# Patient Record
Sex: Male | Born: 1935
Health system: Southern US, Community
[De-identification: ages and names within clinical notes are randomized; demographics above are authoritative.]

## PROBLEM LIST (undated history)

## (undated) DIAGNOSIS — K602 Anal fissure, unspecified: Secondary | ICD-10-CM

## (undated) DIAGNOSIS — J189 Pneumonia, unspecified organism: Secondary | ICD-10-CM

## (undated) DIAGNOSIS — N400 Enlarged prostate without lower urinary tract symptoms: Secondary | ICD-10-CM

## (undated) DIAGNOSIS — Z9889 Other specified postprocedural states: Secondary | ICD-10-CM

## (undated) DIAGNOSIS — E039 Hypothyroidism, unspecified: Secondary | ICD-10-CM

## (undated) DIAGNOSIS — M1990 Unspecified osteoarthritis, unspecified site: Secondary | ICD-10-CM

## (undated) DIAGNOSIS — I2510 Atherosclerotic heart disease of native coronary artery without angina pectoris: Secondary | ICD-10-CM

## (undated) DIAGNOSIS — I350 Nonrheumatic aortic (valve) stenosis: Secondary | ICD-10-CM

## (undated) DIAGNOSIS — I359 Nonrheumatic aortic valve disorder, unspecified: Secondary | ICD-10-CM

## (undated) DIAGNOSIS — K219 Gastro-esophageal reflux disease without esophagitis: Secondary | ICD-10-CM

## (undated) DIAGNOSIS — I6529 Occlusion and stenosis of unspecified carotid artery: Secondary | ICD-10-CM

## (undated) DIAGNOSIS — I447 Left bundle-branch block, unspecified: Secondary | ICD-10-CM

## (undated) HISTORY — DX: Other specified postprocedural states: Z98.890

## (undated) HISTORY — PX: COLONOSCOPY: SHX174

## (undated) HISTORY — DX: Unspecified osteoarthritis, unspecified site: M19.90

## (undated) HISTORY — DX: Occlusion and stenosis of unspecified carotid artery: I65.29

## (undated) HISTORY — DX: Nonrheumatic aortic (valve) stenosis: I35.0

## (undated) HISTORY — DX: Pneumonia, unspecified organism: J18.9

## (undated) HISTORY — DX: Anal fissure, unspecified: K60.2

## (undated) HISTORY — DX: Benign prostatic hyperplasia without lower urinary tract symptoms: N40.0

## (undated) HISTORY — PX: KNEE SURGERY: SHX244

## (undated) HISTORY — DX: Other specified postprocedural states: Z98.89

## (undated) HISTORY — DX: Gastro-esophageal reflux disease without esophagitis: K21.9

## (undated) HISTORY — DX: Left bundle-branch block, unspecified: I44.7

## (undated) HISTORY — DX: Nonrheumatic aortic valve disorder, unspecified: I35.9

## (undated) HISTORY — DX: Atherosclerotic heart disease of native coronary artery without angina pectoris: I25.10

---

## 1973-08-25 HISTORY — PX: SHOULDER SURGERY: SHX246

## 1998-07-04 ENCOUNTER — Ambulatory Visit (HOSPITAL_COMMUNITY): Admission: RE | Admit: 1998-07-04 | Discharge: 1998-07-04 | Payer: Self-pay | Admitting: Specialist

## 1998-07-04 ENCOUNTER — Encounter: Payer: Self-pay | Admitting: Specialist

## 2001-08-25 HISTORY — PX: CORONARY ARTERY BYPASS GRAFT: SHX141

## 2002-03-22 ENCOUNTER — Inpatient Hospital Stay (HOSPITAL_COMMUNITY): Admission: AD | Admit: 2002-03-22 | Discharge: 2002-03-29 | Payer: Self-pay | Admitting: Cardiovascular Disease

## 2002-03-24 ENCOUNTER — Encounter: Payer: Self-pay | Admitting: Thoracic Surgery (Cardiothoracic Vascular Surgery)

## 2002-03-25 ENCOUNTER — Encounter: Payer: Self-pay | Admitting: Thoracic Surgery (Cardiothoracic Vascular Surgery)

## 2002-03-26 ENCOUNTER — Encounter: Payer: Self-pay | Admitting: Thoracic Surgery (Cardiothoracic Vascular Surgery)

## 2002-04-19 ENCOUNTER — Encounter (HOSPITAL_COMMUNITY): Admission: RE | Admit: 2002-04-19 | Discharge: 2002-07-18 | Payer: Self-pay | Admitting: Cardiovascular Disease

## 2002-05-11 ENCOUNTER — Encounter: Payer: Self-pay | Admitting: Thoracic Surgery (Cardiothoracic Vascular Surgery)

## 2002-05-11 ENCOUNTER — Encounter
Admission: RE | Admit: 2002-05-11 | Discharge: 2002-05-11 | Payer: Self-pay | Admitting: Thoracic Surgery (Cardiothoracic Vascular Surgery)

## 2003-06-05 ENCOUNTER — Ambulatory Visit (HOSPITAL_COMMUNITY): Admission: RE | Admit: 2003-06-05 | Discharge: 2003-06-05 | Payer: Self-pay | Admitting: Ophthalmology

## 2003-06-05 ENCOUNTER — Encounter: Payer: Self-pay | Admitting: Ophthalmology

## 2003-06-12 ENCOUNTER — Encounter: Payer: Self-pay | Admitting: Internal Medicine

## 2003-06-12 ENCOUNTER — Encounter: Admission: RE | Admit: 2003-06-12 | Discharge: 2003-06-12 | Payer: Self-pay | Admitting: Internal Medicine

## 2003-07-26 ENCOUNTER — Encounter: Admission: RE | Admit: 2003-07-26 | Discharge: 2003-07-26 | Payer: Self-pay | Admitting: Internal Medicine

## 2004-01-09 ENCOUNTER — Encounter: Admission: RE | Admit: 2004-01-09 | Discharge: 2004-01-09 | Payer: Self-pay | Admitting: Orthopedic Surgery

## 2004-01-11 ENCOUNTER — Ambulatory Visit (HOSPITAL_COMMUNITY): Admission: RE | Admit: 2004-01-11 | Discharge: 2004-01-11 | Payer: Self-pay | Admitting: Orthopedic Surgery

## 2004-01-11 ENCOUNTER — Ambulatory Visit (HOSPITAL_BASED_OUTPATIENT_CLINIC_OR_DEPARTMENT_OTHER): Admission: RE | Admit: 2004-01-11 | Discharge: 2004-01-11 | Payer: Self-pay | Admitting: Orthopedic Surgery

## 2004-07-16 ENCOUNTER — Ambulatory Visit: Payer: Self-pay | Admitting: Internal Medicine

## 2004-10-31 ENCOUNTER — Ambulatory Visit: Payer: Self-pay | Admitting: Internal Medicine

## 2005-05-06 ENCOUNTER — Ambulatory Visit: Payer: Self-pay | Admitting: Internal Medicine

## 2005-05-14 ENCOUNTER — Ambulatory Visit: Payer: Self-pay | Admitting: Internal Medicine

## 2005-07-14 ENCOUNTER — Ambulatory Visit: Payer: Self-pay | Admitting: Internal Medicine

## 2005-09-06 IMAGING — CT CT CHEST W/O CM
1 of 2 series · 13 of 28 positions shown, 17 images · non-contrast
Comparison: none

CLINICAL DATA: Follow-up chest opacities described on prior CT of the chest of 06/12/03.  
CT OF THE CHEST WITHOUT CONTRAST
Multidetector helical scans through the chest were performed in the unenhanced state and compared to the prior CT of the chest from [REDACTED] dated 06/12/03.

[Series 2: routine chest · axial · 0.70mm/px · z∈[-314,-54]mm · 13 of 62 slices shown, 17 images]
[im 5/62  mediastinal]
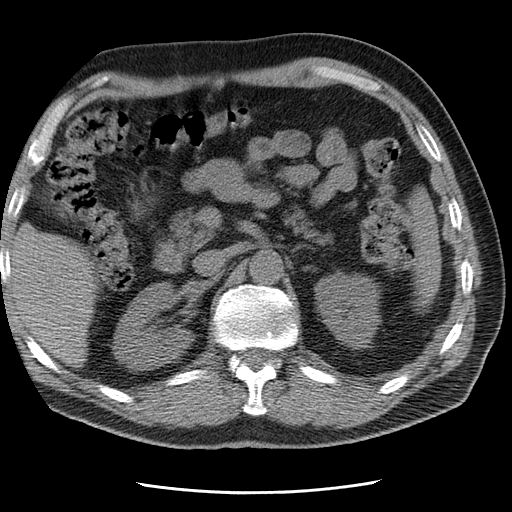
[im 5/62  lung]
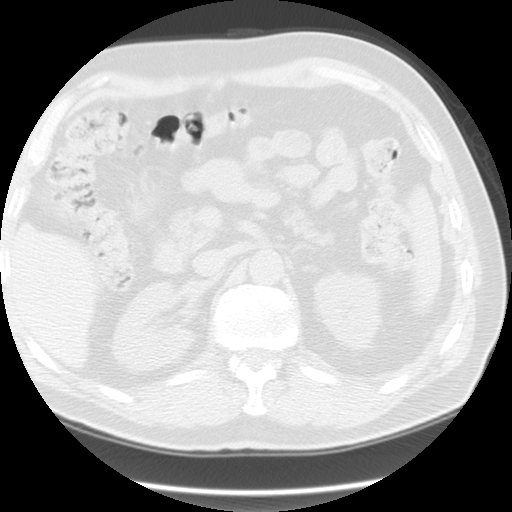
[im 9/62  lung]
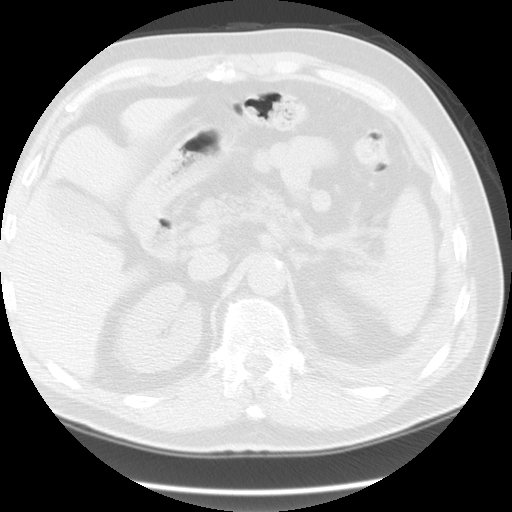
[im 14/62  lung]
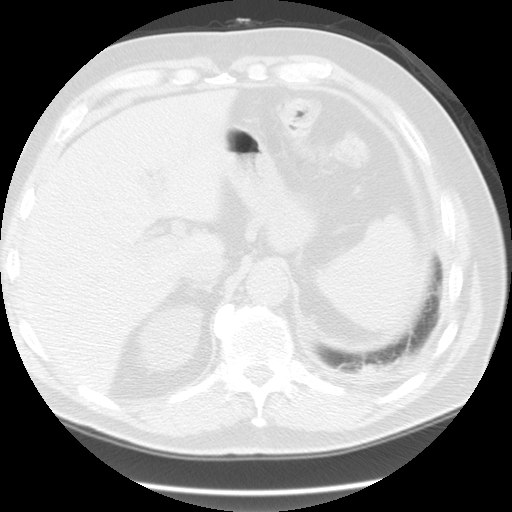
[im 18/62  lung]
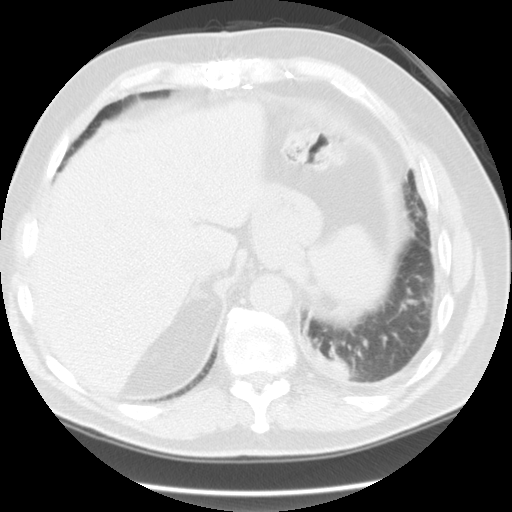
[im 22/62  mediastinal]
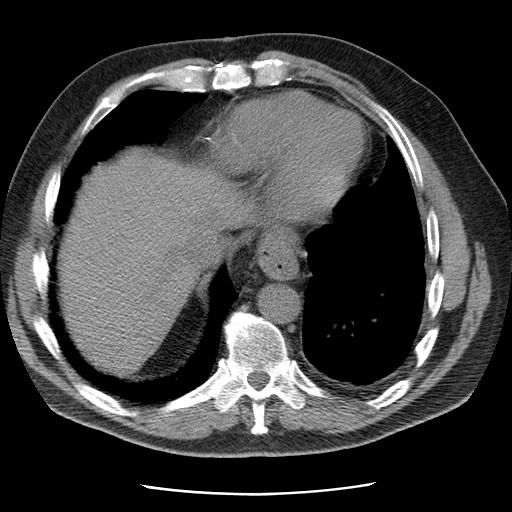
[im 22/62  lung]
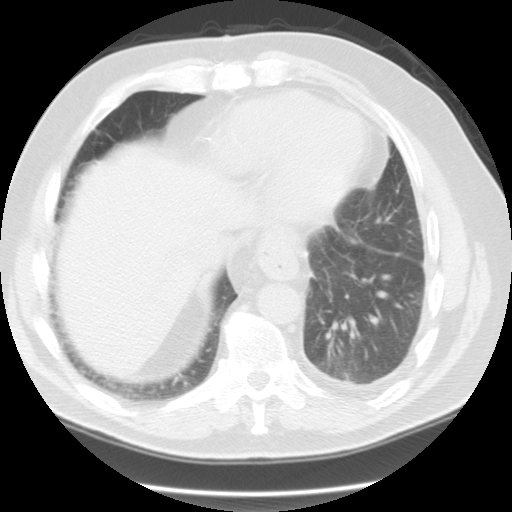
[im 27/62  lung]
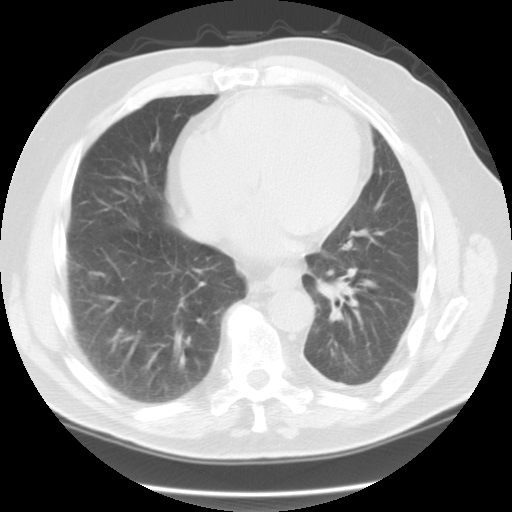
[im 31/62  lung]
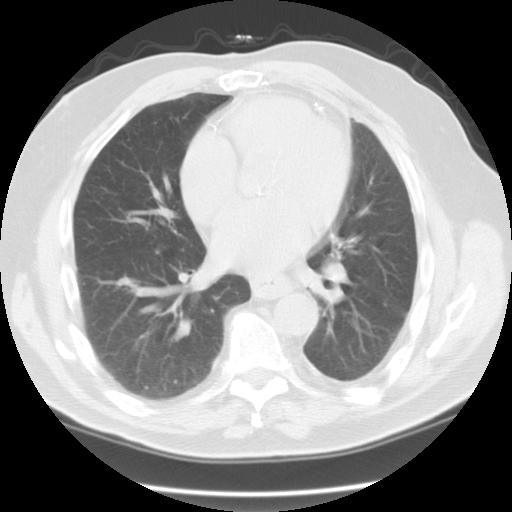
[im 35/62  lung]
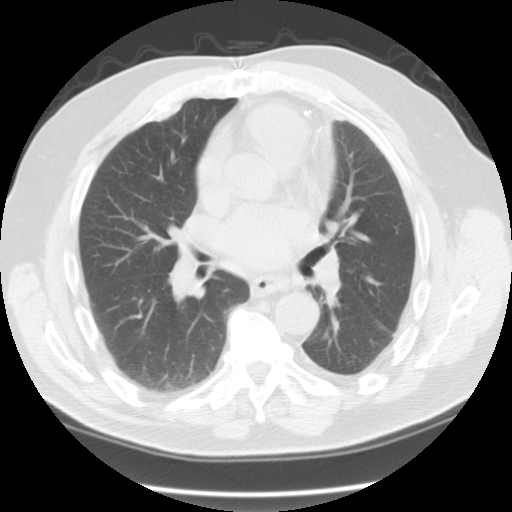
[im 40/62  mediastinal]
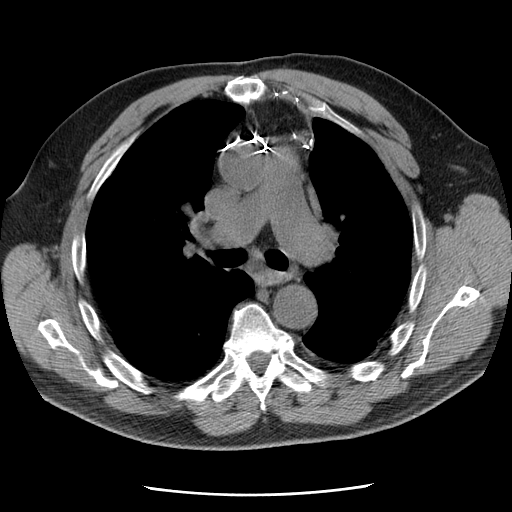
[im 40/62  lung]
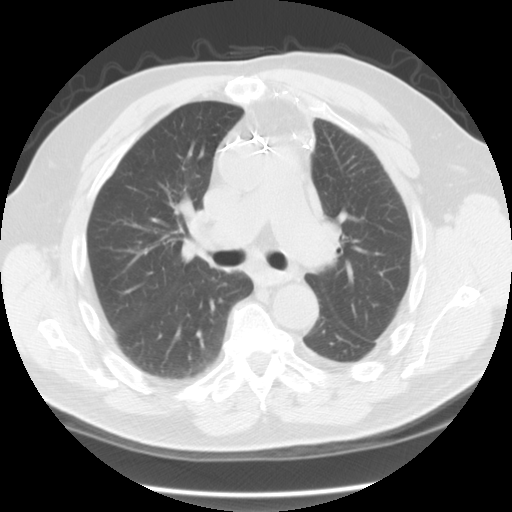
[im 44/62  lung]
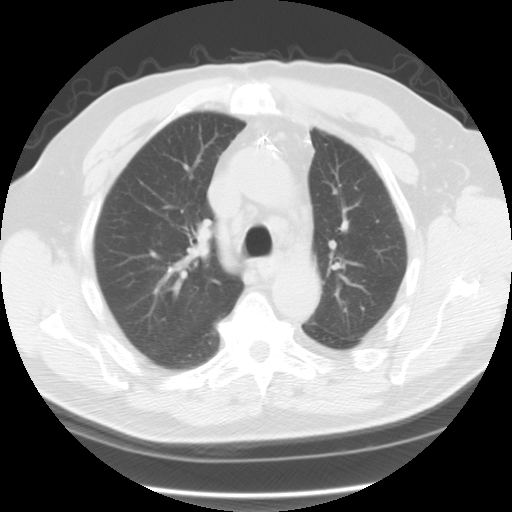
[im 48/62  lung]
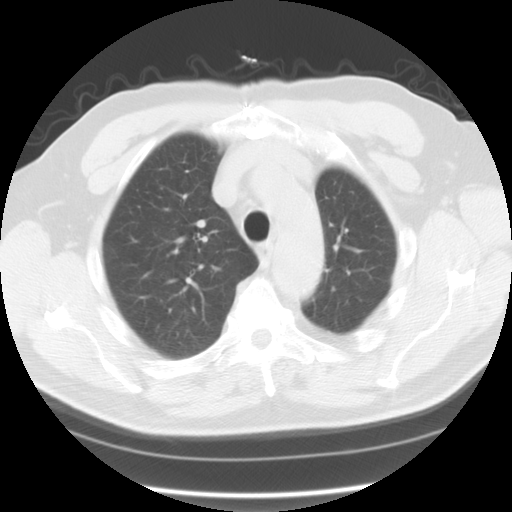
[im 53/62  lung]
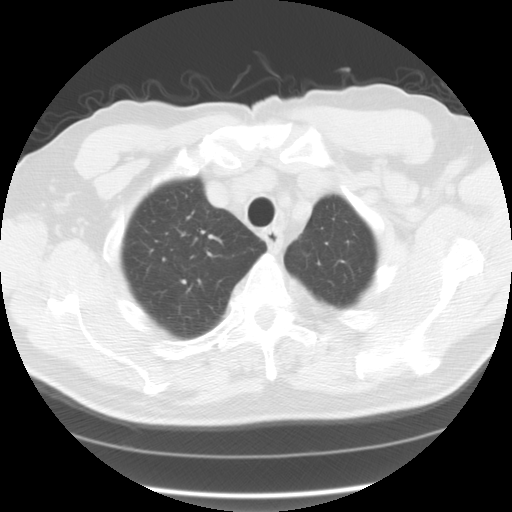
[im 57/62  mediastinal]
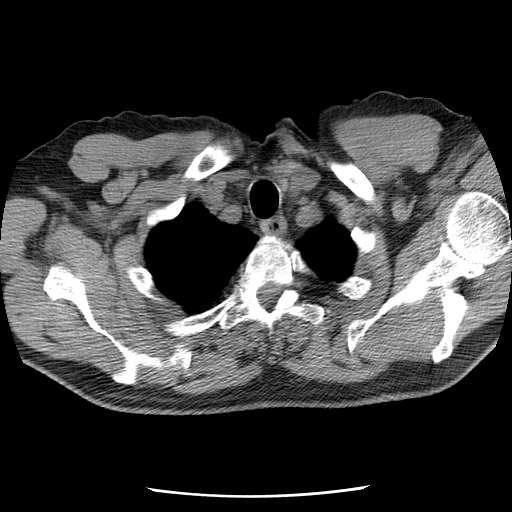
[im 57/62  lung]
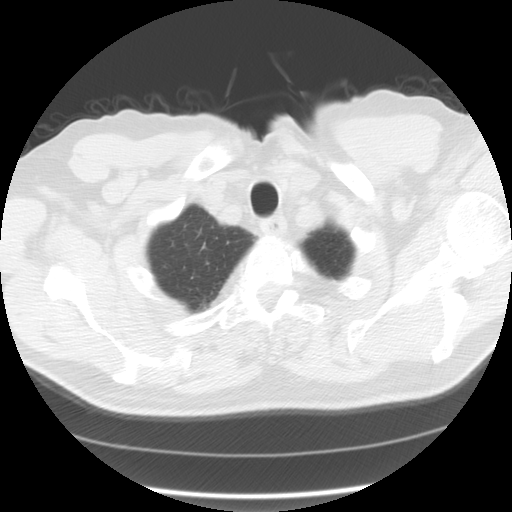

[13 of 28 positions shown; findings below may reference images not displayed]

The mediastinal nodes previously described appear stable.  No definite mediastinal or hilar adenopathy is seen.  Median sternotomy sutures are noted from prior CABG.  Probable pleural scarring at the left lung base remains.  On the lung parenchymal window images, there is still an oval opacity posteriorly in the left lower lobe.  This has not changed significantly in size.  This area measures 3.1 x 0.9 cm and most likely represents round atelectasis or pneumonia.  However, I would suggest at least a limited follow-up CT of the chest through the lung bases in four to six months.  There are foci of pleural thickening anteriorly in both mid to lower hemithoraces, right slightly larger than the left consistent with pleural plaques.  Degenerative changes are noted in the thoracic spine.
IMPRESSION
1.  Stable pleural plaques anteriorly in the lower mid hemithoraces, right slightly larger than left.
2.  Persistent oval parenchymal opacity in the posterior left lower lobe which is perhaps slightly smaller in size.  I would favor this representing round atelectasis or pneumonia in view of the CT appearance, but at least a limited CT in four to six months is suggested to assess further.
3.  Stable mediastinal nodes, none of which are pathologically enlarged.  

co

## 2005-10-22 ENCOUNTER — Ambulatory Visit: Payer: Self-pay | Admitting: Internal Medicine

## 2006-01-01 ENCOUNTER — Ambulatory Visit: Payer: Self-pay | Admitting: Cardiology

## 2006-01-12 ENCOUNTER — Ambulatory Visit: Payer: Self-pay

## 2006-02-20 IMAGING — CR DG CHEST 2V
2 series · 2 of 2 positions shown · non-contrast
Comparison: none

CLINICAL DATA: Preop respiratory exam.  Smoker.  Cough.  Post CABG. 
 CHEST, TWO VIEWS ? 01/09/04 
 Since two view chest x-ray of 05/11/02, there is decrease in left pleural effusion and since chest CT from [REDACTED] of 07/26/03, there is no interval change in chronic left lower lobe pleuroparenchymal fibrosis.  The lungs are otherwise clear.  Heart size remains normal with post CABG changes.  Degenerative changes are seen in the dorsal spine with probable combination of post traumatic and post surgical change at the right acromioclavicular joint.
 IMPRESSION
 Since prior chest CT of 07/26/03:
 1.  Stable left basilar chronic pleuroparenchymal fibrosis.
 2.  No active disease.

[view not recorded (1 of 2)]
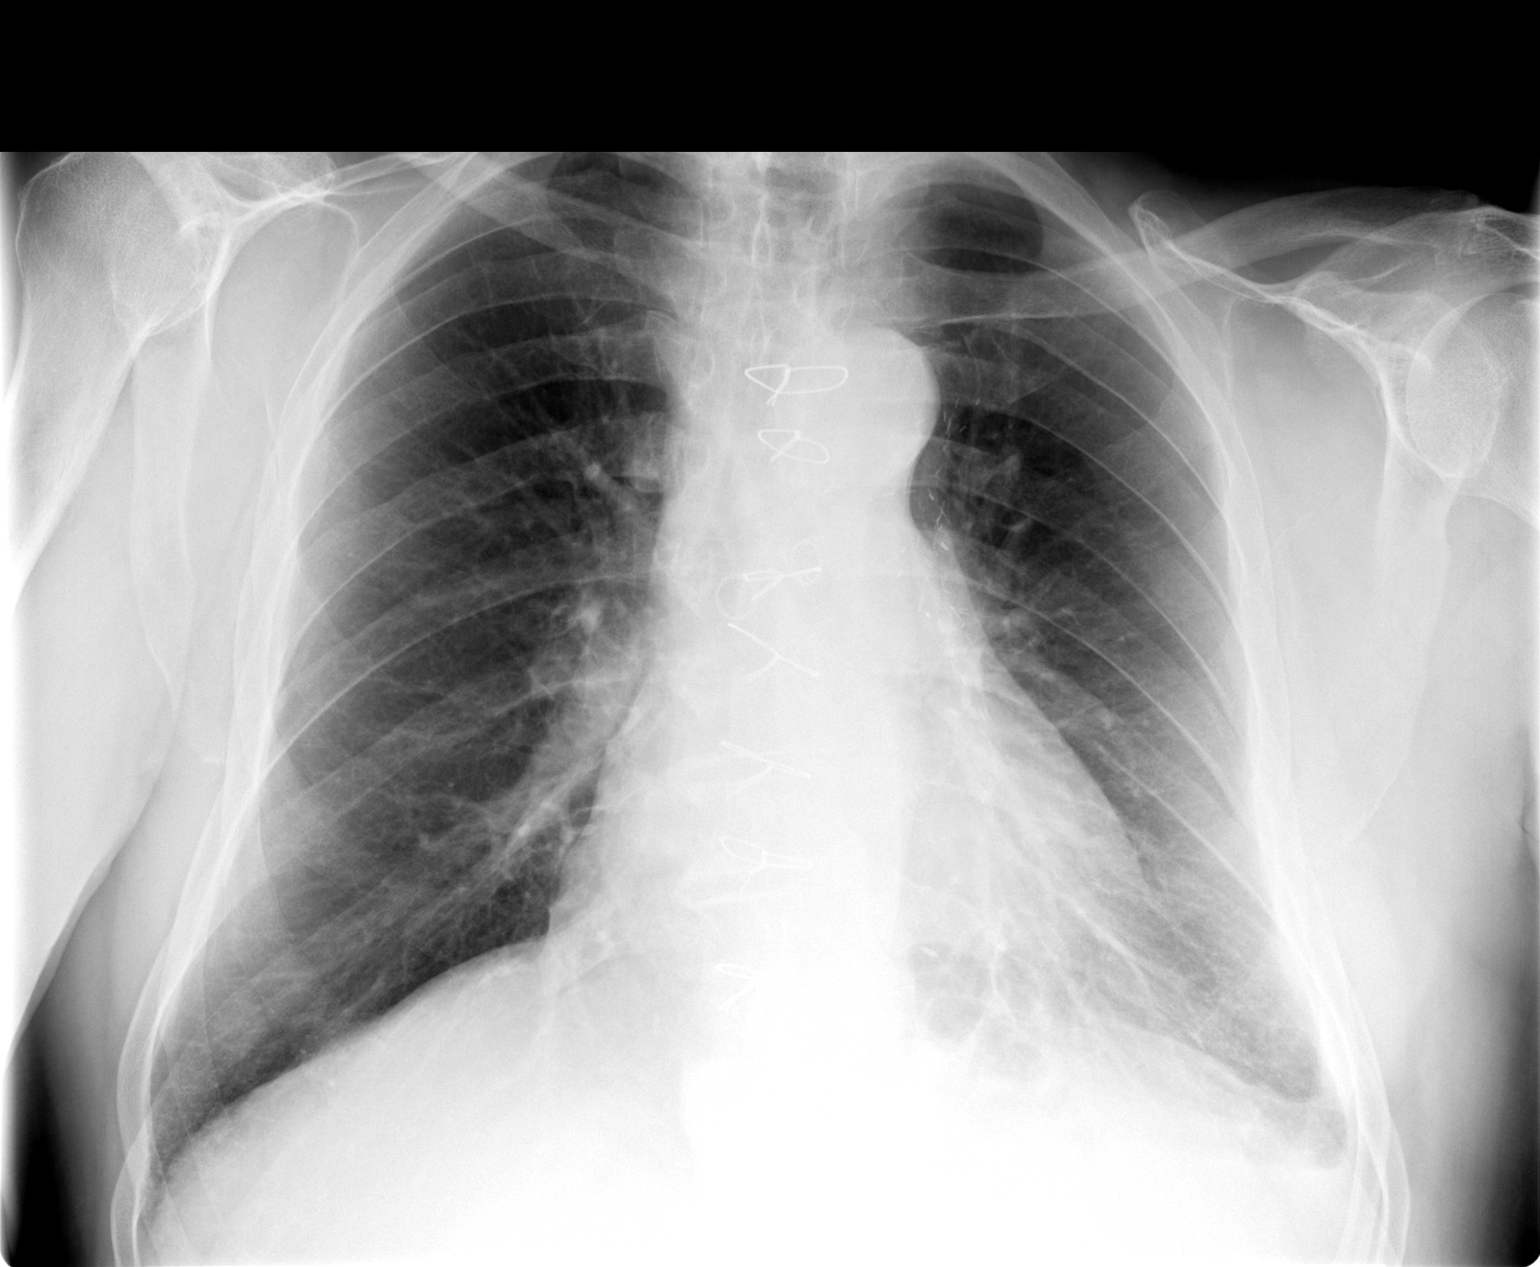

[view not recorded (2 of 2)]
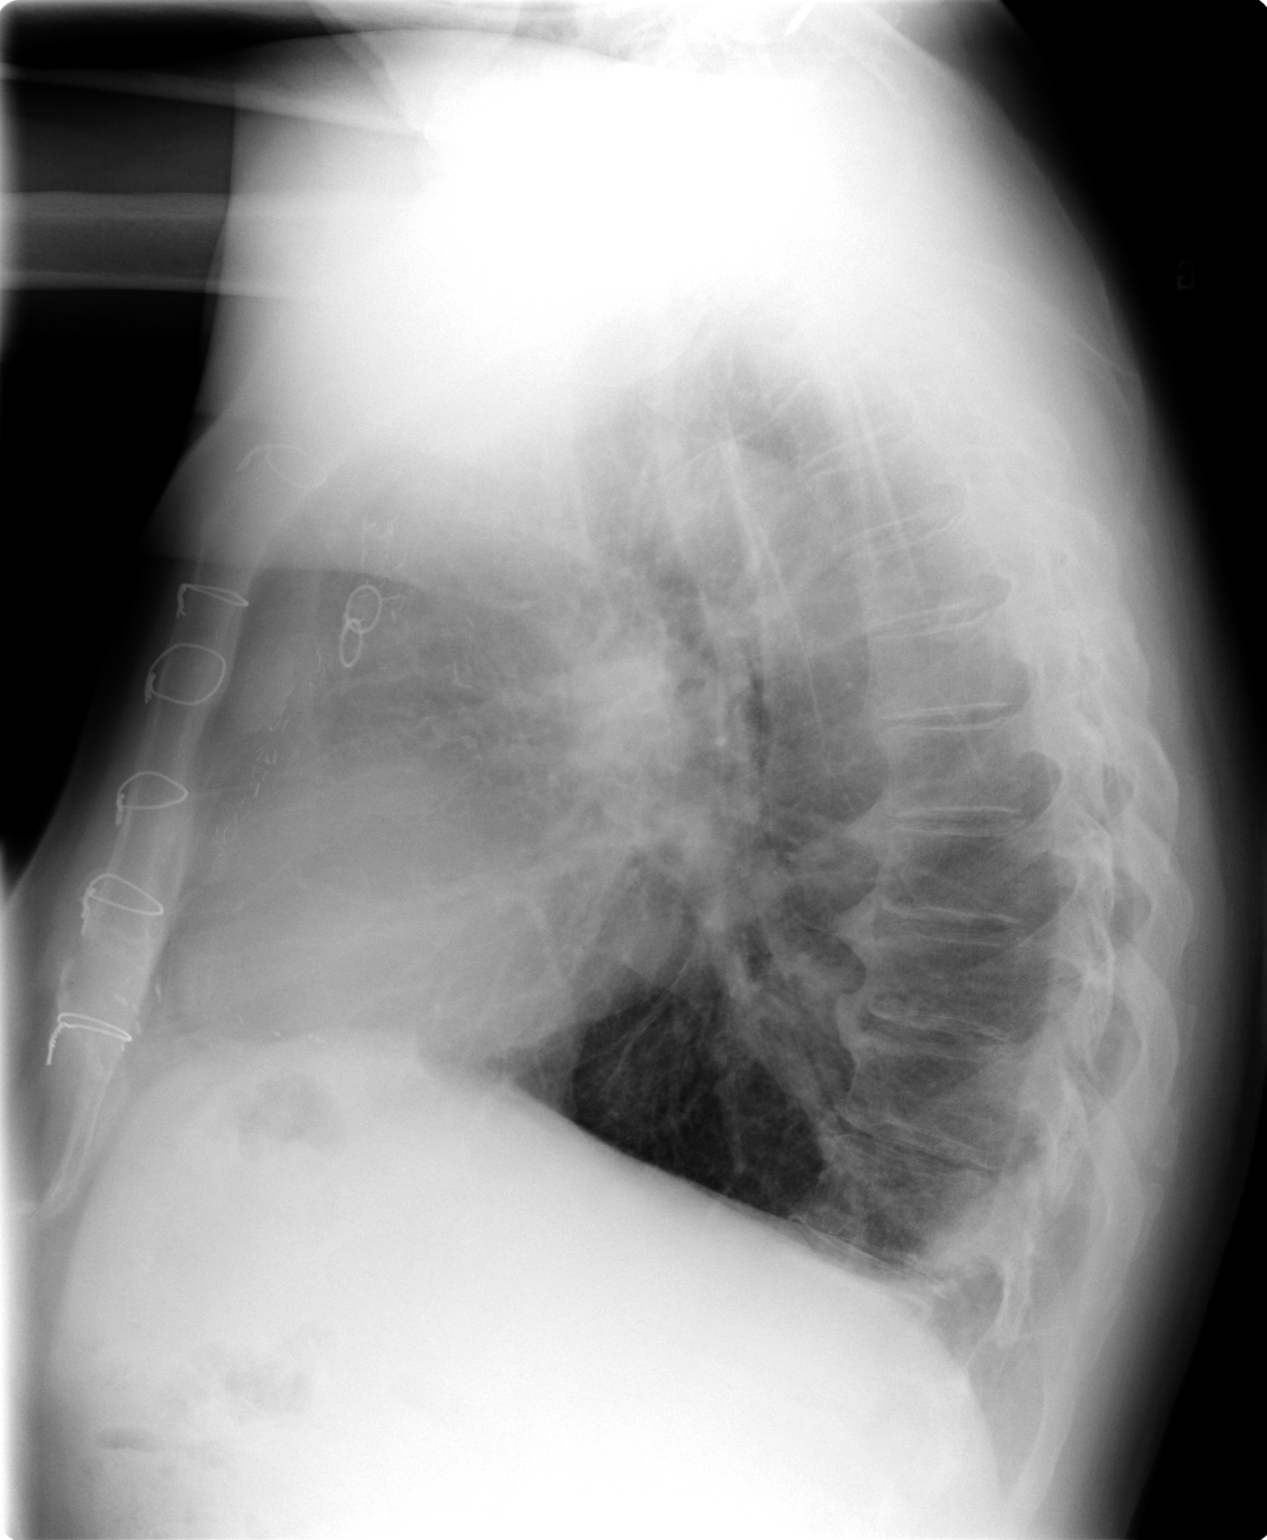

[2 of 2 positions shown; findings below may reference images not displayed]

## 2006-03-06 ENCOUNTER — Ambulatory Visit: Payer: Self-pay | Admitting: Cardiology

## 2006-03-12 ENCOUNTER — Encounter: Payer: Self-pay | Admitting: Internal Medicine

## 2006-03-12 ENCOUNTER — Ambulatory Visit: Payer: Self-pay

## 2006-05-05 ENCOUNTER — Ambulatory Visit: Payer: Self-pay | Admitting: Internal Medicine

## 2006-06-15 ENCOUNTER — Ambulatory Visit: Payer: Self-pay | Admitting: Internal Medicine

## 2006-09-07 ENCOUNTER — Ambulatory Visit: Payer: Self-pay | Admitting: Internal Medicine

## 2006-10-08 ENCOUNTER — Ambulatory Visit: Payer: Self-pay | Admitting: Internal Medicine

## 2007-01-06 ENCOUNTER — Ambulatory Visit: Payer: Self-pay | Admitting: Internal Medicine

## 2007-01-07 ENCOUNTER — Encounter: Payer: Self-pay | Admitting: Internal Medicine

## 2007-01-07 DIAGNOSIS — Z9889 Other specified postprocedural states: Secondary | ICD-10-CM

## 2007-01-07 DIAGNOSIS — M1990 Unspecified osteoarthritis, unspecified site: Secondary | ICD-10-CM | POA: Insufficient documentation

## 2007-01-07 DIAGNOSIS — N400 Enlarged prostate without lower urinary tract symptoms: Secondary | ICD-10-CM

## 2007-01-07 DIAGNOSIS — K219 Gastro-esophageal reflux disease without esophagitis: Secondary | ICD-10-CM

## 2007-01-07 DIAGNOSIS — I2510 Atherosclerotic heart disease of native coronary artery without angina pectoris: Secondary | ICD-10-CM

## 2007-01-07 HISTORY — DX: Unspecified osteoarthritis, unspecified site: M19.90

## 2007-01-07 HISTORY — DX: Gastro-esophageal reflux disease without esophagitis: K21.9

## 2007-01-07 HISTORY — DX: Other specified postprocedural states: Z98.89

## 2007-01-07 HISTORY — DX: Benign prostatic hyperplasia without lower urinary tract symptoms: N40.0

## 2007-01-07 HISTORY — DX: Atherosclerotic heart disease of native coronary artery without angina pectoris: I25.10

## 2007-03-02 ENCOUNTER — Ambulatory Visit: Payer: Self-pay | Admitting: Cardiology

## 2007-04-08 ENCOUNTER — Ambulatory Visit: Payer: Self-pay | Admitting: Internal Medicine

## 2007-04-08 DIAGNOSIS — I359 Nonrheumatic aortic valve disorder, unspecified: Secondary | ICD-10-CM | POA: Insufficient documentation

## 2007-04-08 HISTORY — DX: Nonrheumatic aortic valve disorder, unspecified: I35.9

## 2007-06-16 ENCOUNTER — Ambulatory Visit: Payer: Self-pay | Admitting: Internal Medicine

## 2007-07-02 ENCOUNTER — Ambulatory Visit: Payer: Self-pay | Admitting: Internal Medicine

## 2007-07-02 DIAGNOSIS — E785 Hyperlipidemia, unspecified: Secondary | ICD-10-CM | POA: Insufficient documentation

## 2007-07-02 LAB — CONVERTED CEMR LAB
Albumin: 3.8 g/dL (ref 3.5–5.2)
Alkaline Phosphatase: 58 units/L (ref 39–117)
BUN: 20 mg/dL (ref 6–23)
Basophils Absolute: 0.1 10*3/uL (ref 0.0–0.1)
Cholesterol: 136 mg/dL (ref 0–200)
Eosinophils Absolute: 0.1 10*3/uL (ref 0.0–0.6)
GFR calc Af Amer: 92 mL/min
GFR calc non Af Amer: 76 mL/min
HCT: 40.7 % (ref 39.0–52.0)
HDL: 47.1 mg/dL (ref >39.0)
Hemoglobin: 14.3 g/dL (ref 13.0–17.0)
LDL Cholesterol: 72 mg/dL (ref 0–99)
MCHC: 35.1 g/dL (ref 30.0–36.0)
MCV: 91.2 fL (ref 78.0–100.0)
Monocytes Absolute: 0.6 10*3/uL (ref 0.2–0.7)
Monocytes Relative: 8.2 % (ref 3.0–11.0)
Neutrophils Relative %: 71.7 % (ref 43.0–77.0)
PSA: 1.53 ng/mL (ref 0.10–4.00)
Potassium: 4.3 meq/L (ref 3.5–5.1)
RDW: 13.2 % (ref 11.5–14.6)
Sodium: 142 meq/L (ref 135–145)
TSH: 2.53 microintl units/mL (ref 0.35–5.50)
Total CHOL/HDL Ratio: 2.9

## 2007-11-10 ENCOUNTER — Telehealth: Payer: Self-pay | Admitting: Internal Medicine

## 2008-01-26 ENCOUNTER — Ambulatory Visit: Payer: Self-pay | Admitting: Internal Medicine

## 2008-03-02 ENCOUNTER — Ambulatory Visit: Payer: Self-pay | Admitting: Cardiology

## 2008-03-14 ENCOUNTER — Ambulatory Visit: Payer: Self-pay

## 2008-05-29 ENCOUNTER — Ambulatory Visit: Payer: Self-pay | Admitting: Internal Medicine

## 2008-05-29 DIAGNOSIS — J069 Acute upper respiratory infection, unspecified: Secondary | ICD-10-CM | POA: Insufficient documentation

## 2009-03-19 ENCOUNTER — Ambulatory Visit: Payer: Self-pay | Admitting: Cardiology

## 2009-03-19 DIAGNOSIS — I6529 Occlusion and stenosis of unspecified carotid artery: Secondary | ICD-10-CM

## 2009-03-19 HISTORY — DX: Occlusion and stenosis of unspecified carotid artery: I65.29

## 2009-03-28 ENCOUNTER — Ambulatory Visit: Payer: Self-pay

## 2009-03-28 ENCOUNTER — Ambulatory Visit: Payer: Self-pay | Admitting: Cardiology

## 2009-03-28 ENCOUNTER — Encounter: Payer: Self-pay | Admitting: Cardiology

## 2009-03-28 DIAGNOSIS — I2510 Atherosclerotic heart disease of native coronary artery without angina pectoris: Secondary | ICD-10-CM

## 2009-03-28 HISTORY — DX: Atherosclerotic heart disease of native coronary artery without angina pectoris: I25.10

## 2009-04-05 ENCOUNTER — Telehealth: Payer: Self-pay | Admitting: Cardiology

## 2009-04-10 LAB — CONVERTED CEMR LAB
Cholesterol: 118 mg/dL (ref 0–200)
HDL: 43.3 mg/dL (ref >39.00)
Triglycerides: 86 mg/dL (ref 0.0–149.0)
VLDL: 17.2 mg/dL (ref 0.0–40.0)

## 2009-05-04 ENCOUNTER — Telehealth (INDEPENDENT_AMBULATORY_CARE_PROVIDER_SITE_OTHER): Payer: Self-pay | Admitting: *Deleted

## 2009-08-30 ENCOUNTER — Encounter: Payer: Self-pay | Admitting: Internal Medicine

## 2009-08-31 ENCOUNTER — Ambulatory Visit: Payer: Self-pay | Admitting: Internal Medicine

## 2009-08-31 DIAGNOSIS — B369 Superficial mycosis, unspecified: Secondary | ICD-10-CM | POA: Insufficient documentation

## 2009-11-23 ENCOUNTER — Telehealth: Payer: Self-pay

## 2009-12-03 ENCOUNTER — Ambulatory Visit: Payer: Self-pay | Admitting: Internal Medicine

## 2010-03-08 ENCOUNTER — Encounter: Payer: Self-pay | Admitting: Cardiology

## 2010-03-12 ENCOUNTER — Ambulatory Visit: Payer: Self-pay | Admitting: Cardiology

## 2010-03-29 ENCOUNTER — Encounter: Payer: Self-pay | Admitting: Cardiovascular Disease

## 2010-03-29 ENCOUNTER — Ambulatory Visit: Payer: Self-pay

## 2010-09-24 NOTE — Progress Notes (Signed)
Summary: ins ppwk - will need rov  ins ppwk brought in to be filled out - per Dr. Amador Cunas - will need rov to complete. KIK

## 2010-09-24 NOTE — Assessment & Plan Note (Signed)
Summary: 4 month rov/njRESH/COMP FORMS/RCD   Vital Signs:  Patient profile:   77 year old male Weight:      217 pounds Temp:     97.9 degrees F oral BP sitting:   130 / 70  (right arm) Cuff size:   regular  Vitals Entered By: Duard Brady LPN (December 03, 2009 3:12 PM) CC: roa - insurance ppwk to be filled out - doing fine Is Patient Diabetic? No   Primary Care Provider:  Gordy Savers  MD  CC:  roa - insurance ppwk to be filled out - doing fine.  History of Present Illness: 77 year old patient who is seen today for follow-up.  He has a history of coronary artery disease, dyslipidemia, mild aortic stenosis, and gastroesophageal reflux disease.  He is in today primarily for a questionnaire to be completed and a brief  health exam required by his insurance company to assess his driving ability and risk.  His cardiac status has been stable.  He denies any exertional shortness of breath or chest pain. He is scheduled for follow up with his ophthalmologist later this month.  He does use hearing aids  Preventive Screening-Counseling & Management  Alcohol-Tobacco     Smoking Status: quit  Allergies (verified): No Known Drug Allergies  Past History:  Past Medical History: Coronary artery disease status post CABG July 2003 GERD Benign prostatic hypertrophy Hyperlipidemia Osteoarthritis mild aortic stenosis diminished auditory acuity  Review of Systems  The patient denies anorexia, fever, weight loss, weight gain, vision loss, decreased hearing, hoarseness, chest pain, syncope, dyspnea on exertion, peripheral edema, prolonged cough, headaches, hemoptysis, abdominal pain, melena, hematochezia, severe indigestion/heartburn, hematuria, incontinence, genital sores, muscle weakness, suspicious skin lesions, transient blindness, difficulty walking, depression, unusual weight change, abnormal bleeding, enlarged lymph nodes, angioedema, breast masses, and testicular masses.     Physical Exam  General:  overweight-appearing.  blood pressure 130/78 Head:  Normocephalic and atraumatic without obvious abnormalities. No apparent alopecia or balding. Eyes:  No corneal or conjunctival inflammation noted. EOMI. Perrla. Funduscopic exam benign, without hemorrhages, exudates or papilledema. Vision grossly normal.  visual acuity 2040 Ears:  hearing aids in place bilaterally Mouth:  Oral mucosa and oropharynx without lesions or exudates.  Teeth in good repair. Neck:  carotid bruits noted Lungs:  Normal respiratory effort, chest expands symmetrically. Lungs are clear to auscultation, no crackles or wheezes. Heart:  grade 3/6 systolic murmur Abdomen:  Bowel sounds positive,abdomen soft and non-tender without masses, organomegaly or hernias noted. Msk:  No deformity or scoliosis noted of thoracic or lumbar spine.   Pulses:  R and L carotid,radial,femoral,dorsalis pedis and posterior tibial pulses are full and equal bilaterally Extremities:  No clubbing, cyanosis, edema, or deformity noted with normal full range of motion of all joints.     Impression & Recommendations:  Problem # 1:  CORONARY ATHEROSCLEROSIS NATIVE CORONARY ARTERY (ICD-414.01)  His updated medication list for this problem includes:    Adult Aspirin Ec Low Strength 81 Mg Tbec (Aspirin) .Marland Kitchen... 1 once daily- 3 times a week  Problem # 2:  OSTEOARTHRITIS (ICD-715.90)  His updated medication list for this problem includes:    Adult Aspirin Ec Low Strength 81 Mg Tbec (Aspirin) .Marland Kitchen... 1 once daily- 3 times a week  Problem # 3:  AORTIC STENOSIS, MILD (ICD-424.1)  His updated medication list for this problem includes:    Adult Aspirin Ec Low Strength 81 Mg Tbec (Aspirin) .Marland Kitchen... 1 once daily- 3 times a week  Complete Medication  List: 1)  Zocor 20 Mg Tabs (Simvastatin) .... Take 1 tablet by mouth at bedtime 2)  Adult Aspirin Ec Low Strength 81 Mg Tbec (Aspirin) .Marland Kitchen.. 1 once daily- 3 times a week 3)  Bleph-10 10 %  Soln (Sulfacetamide sodium) .... 2 drops four times daily as needed 4)  Glucosamine-chondroitin 250-500 Mg Caps (Glucosamine-chondroitin) .... Once daily 5)  Nystatin-triamcinolone 100000-0.1 Unit/gm-% Crea (Nystatin-triamcinolone) .... Used twice daily 6)  Triamcinolone Acetonide 0.1 % Crea (Triamcinolone acetonide) .... Used twice daily as needed  Patient Instructions: 1)  Please schedule a follow-up appointment in 4 months. 2)  Limit your Sodium (Salt) to less than 2 grams a day(slightly less than 1/2 a teaspoon) to prevent fluid retention, swelling, or worsening of symptoms. Prescriptions: TRIAMCINOLONE ACETONIDE 0.1 % CREA (TRIAMCINOLONE ACETONIDE) used twice daily as needed  #60 gm x 2   Entered and Authorized by:   Gordy Savers  MD   Signed by:   Gordy Savers  MD on 12/03/2009   Method used:   Electronically to        [facility] [phone]* (retail)       [address] [place]  [address]       Ph: [phone] or [phone]       Fax: [phone]   RxID:   [id number] TRIAMCINOLONE ACETONIDE 0.1 % CREA (TRIAMCINOLONE ACETONIDE) used twice daily as needed  #60 gm x 2   Entered and Authorized by:   Gordy Savers  MD   Signed by:   Gordy Savers  MD on 12/03/2009   Method used:   Print then Give to Patient   RxID:   [id number]  No

## 2010-09-24 NOTE — Miscellaneous (Signed)
  Clinical Lists Changes  Observations: Added new observation of ECHOINTERP:  1. Left ventricle: The cavity size was normal. There was mild        concentric hypertrophy. Systolic function was normal. The        estimated ejection fraction was in the range of 55% to 60%. Wall        motion was normal; there were no regional wall motion        abnormalities. Doppler parameters are consistent with abnormal        left ventricular relaxation (grade 1 diastolic dysfunction).     2. Aortic valve: There was mild stenosis. Mild regurgitation.     3. Mitral valve: Mild regurgitation.     4. Left atrium: The atrium was mildly dilated.     5. Right atrium: The atrium was mildly dilated.     6. Pulmonary arteries: Systolic pressure was mildly increased. (03/28/2009 10:45)      Echocardiogram  Procedure date:  03/28/2009  Findings:       1. Left ventricle: The cavity size was normal. There was mild        concentric hypertrophy. Systolic function was normal. The        estimated ejection fraction was in the range of 55% to 60%. Wall        motion was normal; there were no regional wall motion        abnormalities. Doppler parameters are consistent with abnormal        left ventricular relaxation (grade 1 diastolic dysfunction).     2. Aortic valve: There was mild stenosis. Mild regurgitation.     3. Mitral valve: Mild regurgitation.     4. Left atrium: The atrium was mildly dilated.     5. Right atrium: The atrium was mildly dilated.     6. Pulmonary arteries: Systolic pressure was mildly increased.

## 2010-09-24 NOTE — Assessment & Plan Note (Signed)
Summary: yearly/sl  Medications Added NYSTATIN-TRIAMCINOLONE 100000-0.1 UNIT/GM-% CREA (NYSTATIN-TRIAMCINOLONE) as needed MULTIVITAMINS  TABS (MULTIPLE VITAMIN) Take 1 tablet by mouth once a day      Allergies Added: NKDA  Visit Type:  Follow-up Primary Provider:  Gordy Savers  MD  CC:  No complains.  History of Present Illness: The patient presents for yearly followup. Since I last saw him he has done well. He walks on the treadmill at the gym. He might get slightly breathless but thinks this is in keeping with what he is doing. He has no resting shortness of breath, PND or orthopnea. He has no chest pressure, neck or arm discomfort. He has no palpitations, presyncope or syncope. Last year he had an echocardiogram demonstrating that his aortic stenosis remains mild and he has a well preserved ejection fraction.  Current Medications (verified): 1)  Zocor 20 Mg Tabs (Simvastatin) .... Take 1 Tablet By Mouth At Bedtime 2)  Adult Aspirin Ec Low Strength 81 Mg  Tbec (Aspirin) .Marland Kitchen.. 1 Once Daily- 3 Times A Week 3)  Glucosamine-Chondroitin 250-500 Mg  Caps (Glucosamine-Chondroitin) .... Once Daily 4)  Nystatin-Triamcinolone 100000-0.1 Unit/gm-% Crea (Nystatin-Triamcinolone) .... As Needed 5)  Triamcinolone Acetonide 0.1 % Crea (Triamcinolone Acetonide) .... Used Twice Daily As Needed 6)  Multivitamins  Tabs (Multiple Vitamin) .... Take 1 Tablet By Mouth Once A Day  Allergies (verified): No Known Drug Allergies  Past History:  Past Medical History: Coronary artery disease status post CABG July 2003 GERD Benign prostatic hypertrophy Hyperlipidemia Osteoarthritis Mild aortic stenosis Diminished auditory acuity Mild bilateral carotid artery stenosis  Past Surgical History: Reviewed history from 03/16/2009 and no changes required. Coronary artery bypass graft (2003) (LIMA to the LAD, SVG to     first diagonal, SVG to obtuse marginal, and SVG to the PDA). Right shoulder surgery  1975 Right knee surgery 94 and 99  Review of Systems       As stated in the HPI and negative for all other systems.   Vital Signs:  Patient profile:   77 year old male Height:      72 inches Weight:      217.50 pounds BMI:     29.60 Pulse rate:   73 / minute Pulse rhythm:   regular Resp:     18 per minute BP sitting:   130 / 70  (right arm) Cuff size:   large  Vitals Entered By: Vikki Ports (March 12, 2010 2:35 PM)  Physical Exam  General:  Well developed, well nourished, in no acute distress. Head:  normocephalic and atraumatic Eyes:  PERRLA/EOM intact; conjunctiva and lids normal. Mouth:  Teeth, gums and palate normal. Oral mucosa normal. Neck:  Neck supple, no JVD. No masses, thyromegaly or abnormal cervical nodes. Chest Wall:  well healed sternotomy scar Lungs:  Clear bilaterally to auscultation and percussion. Abdomen:  Bowel sounds positive; abdomen soft and non-tender without masses, organomegaly, or hernias noted. No hepatosplenomegaly. Msk:  Back normal, normal gait. Muscle strength and tone normal. Extremities:  No clubbing or cyanosis. Neurologic:  Alert and oriented x 3. Skin:  Intact without lesions or rashes. Cervical Nodes:  no significant adenopathy Inguinal Nodes:  no significant adenopathy Psych:  Normal affect.   Detailed Cardiovascular Exam  Neck    Carotids: soft bilateral carotid artery bruits    Neck Veins: Normal, no JVD.    Heart    Inspection: no deformities or lifts noted.      Palpation: normal PMI with no thrills  palpable.      Auscultation: S1 and S2 within normal limits, no S3, no S4, no clicks, no rubs, 2/6 apical systolic murmur radiating slightly at the aortic outflow tract, no diastolic murmurs  Vascular    Abdominal Aorta: no palpable masses, pulsations, or audible bruits.      Femoral Pulses: normal femoral pulses bilaterally.      Pedal Pulses: normal pedal pulses bilaterally.      Radial Pulses: normal radial pulses  bilaterally.      Peripheral Circulation: no clubbing, cyanosis, or edema noted with normal capillary refill.     EKG  Procedure date:  03/12/2010  Findings:      Sinus rhythm, rate 73, axis within normal limits, no acute ST-T wave changes  Impression & Recommendations:  Problem # 1:  CORONARY ATHEROSCLEROSIS NATIVE CORONARY ARTERY (ICD-414.01) He is having no new symptoms. No further cardiovascular testing is suggested. He will continue with risk reduction. Orders: EKG w/ Interpretation (93000)  Problem # 2:  CAROTID STENOSIS (ICD-433.10) He is due for followup carotid Dopplers. Orders: Carotid Duplex (Carotid Duplex)  Problem # 3:  HYPERLIPIDEMIA (ICD-272.4) I will review his lipid profile from the [place]. His wife will bring a copy. The goal will be an LDL less than 100 and HDL greater than 40.  Problem # 4:  AORTIC STENOSIS, MILD (ICD-424.1) He has mild stenosis. I reviewed the echo from last year. There has been no change clinically. No further evaluation is warranted at this time.  Patient Instructions: 1)  Your physician recommends that you schedule a follow-up appointment in: 64yr with Dr Antoine Poche 2)  Your physician recommends that you continue on your current medications as directed. Please refer to the Current Medication list given to you today. 3)  Your physician has requested that you have a carotid duplex. This test is an ultrasound of the carotid arteries in your neck. It looks at blood flow through these arteries that supply the brain with blood. Allow one hour for this exam. There are no restrictions or special instructions.

## 2010-09-24 NOTE — Assessment & Plan Note (Signed)
Summary: rash/njr   Vital Signs:  Patient profile:   77 year old male Weight:      215 pounds BMI:     29.26 Temp:     98.0 degrees F oral BP sitting:   122 / 72  (left arm) Cuff size:   regular  Vitals Entered By: Raechel Ache, RN (August 31, 2009 11:28 AM) CC: C/o itchy rash groin, buttocks.   Primary Care Provider:  Gordy Savers  MD  CC:  C/o itchy rash groin and buttocks.Marland Kitchen  History of Present Illness: 77 year old patient who is seen today with a several week history of a pruritic rash in the groin and inner thigh area.  He has been using and an unknown topical medication without much benefit.  He is followed closely by cardiology for coronary artery disease.  Allergies: No Known Drug Allergies  Past History:  Past Medical History: Reviewed history from 03/16/2009 and no changes required. Coronary artery disease status post CABG July 2003 GERD Benign prostatic hypertrophy Hyperlipidemia Osteoarthritis mild aortic stenosis  Physical Exam  General:  Well-developed,well-nourished,in no acute distress; alert,appropriate and cooperative throughout examination Skin:  an erythematous, patchy, slightly raised dermatitis noted in the groin and inner thigh region   Impression & Recommendations:  Problem # 1:  FUNGAL DERMATITIS (ICD-111.9)  His updated medication list for this problem includes:    Nystatin-triamcinolone 100000-0.1 Unit/gm-% Crea (Nystatin-triamcinolone) ..... Used twice daily  Complete Medication List: 1)  Zocor 20 Mg Tabs (Simvastatin) .... Take 1 tablet by mouth at bedtime 2)  Adult Aspirin Ec Low Strength 81 Mg Tbec (Aspirin) .Marland Kitchen.. 1 once daily- 3 times a week 3)  Bleph-10 10 % Soln (Sulfacetamide sodium) .... 2 drops four times daily as needed 4)  Glucosamine-chondroitin 250-500 Mg Caps (Glucosamine-chondroitin) .... Once daily 5)  Nystatin-triamcinolone 100000-0.1 Unit/gm-% Crea (Nystatin-triamcinolone) .... Used twice daily  Patient  Instructions: 1)  Please schedule a follow-up appointment in 4 months. 2)  Advised not to eat any food or drink any liquids after 10 PM the night before your procedure. Prescriptions: NYSTATIN-TRIAMCINOLONE 100000-0.1 UNIT/GM-% CREA (NYSTATIN-TRIAMCINOLONE) used twice daily  #60 gm x 3   Entered and Authorized by:   Gordy Savers  MD   Signed by:   Gordy Savers  MD on 08/31/2009   Method used:   Print then Give to Patient   RxID:   [id number]

## 2010-11-07 ENCOUNTER — Encounter: Payer: Self-pay | Admitting: Internal Medicine

## 2010-11-07 ENCOUNTER — Ambulatory Visit (INDEPENDENT_AMBULATORY_CARE_PROVIDER_SITE_OTHER): Payer: Medicare Other | Admitting: Internal Medicine

## 2010-11-07 DIAGNOSIS — E785 Hyperlipidemia, unspecified: Secondary | ICD-10-CM

## 2010-11-07 DIAGNOSIS — I2510 Atherosclerotic heart disease of native coronary artery without angina pectoris: Secondary | ICD-10-CM

## 2010-11-07 DIAGNOSIS — J189 Pneumonia, unspecified organism: Secondary | ICD-10-CM

## 2010-11-07 MED ORDER — AZITHROMYCIN 250 MG PO TABS: ORAL_TABLET | ORAL | Status: AC

## 2010-11-07 NOTE — Progress Notes (Signed)
  Subjective:    Patient ID: Zachary Ball, male    DOB: 1925-02-02, 77 y.o.   MRN: [id number]  HPI  77 year old patient who has a history of dyslipidemia coronary artery disease with mild aortic stenosis. He has carotid artery stenosis. For the past 3 weeks he has had intermittent productive cough area and there's been no definite fever he has felt weak with waxing and waning symptoms. No definite fever or chills. Denies any chest pain. He has had a flu vaccine earlier last year and also a Pneumovax in 2008;  his cardiopulmonary status has been stable except for the productive cough   Review of Systems  Constitutional: Positive for fatigue. Negative for fever, chills and appetite change.  HENT: Negative for hearing loss, ear pain, congestion, sore throat, trouble swallowing, neck stiffness, dental problem, voice change and tinnitus.   Eyes: Negative for pain, discharge and visual disturbance.  Respiratory: Positive for cough. Negative for chest tightness, wheezing and stridor.   Cardiovascular: Negative for chest pain, palpitations and leg swelling.  Gastrointestinal: Negative for nausea, vomiting, abdominal pain, diarrhea, constipation, blood in stool and abdominal distention.  Genitourinary: Negative for urgency, hematuria, flank pain, discharge, difficulty urinating and genital sores.  Musculoskeletal: Negative for myalgias, back pain, joint swelling, arthralgias and gait problem.  Skin: Negative for rash.  Neurological: Negative for dizziness, syncope, speech difficulty, weakness, numbness and headaches.  Hematological: Negative for adenopathy. Does not bruise/bleed easily.  Psychiatric/Behavioral: Negative for behavioral problems and dysphoric mood. The patient is not nervous/anxious.        Objective:   Physical Exam  Constitutional: He is oriented to person, place, and time. He appears well-developed.  HENT:  Head: Normocephalic.  Right Ear: External ear normal.  Left Ear:  External ear normal.  Eyes: Conjunctivae and EOM are normal.  Neck: Normal range of motion.  Cardiovascular: Normal rate and normal heart sounds.   Pulmonary/Chest: Effort normal. He has rales.        Extensive rales involving the left lower lung field  Abdominal: Bowel sounds are normal.  Musculoskeletal: Normal range of motion. He exhibits no edema and no tenderness.  Neurological: He is alert and oriented to person, place, and time.  Psychiatric: He has a normal mood and affect. His behavior is normal.          Assessment & Plan:   probable left lower lobe pneumonia. We'll treat with azithromycin expectorants. Will call if unimproved.  Dyslipidemia. Stable on simvastatin. We'll continue.  Coronary artery disease stable

## 2010-11-07 NOTE — Patient Instructions (Signed)
Get plenty of rest, Drink lots of  clear liquids, and use Tylenol or ibuprofen for fever and discomfort.   Call or return to clinic prn if these symptoms worsen or fail to improve as anticipated. Take your antibiotic as prescribed until ALL of it is gone, but stop if you develop a rash, swelling, or any side effects of the medication.  Contact our office as soon as possible if  there are side effects of the medication.  

## 2010-12-16 ENCOUNTER — Telehealth: Payer: Self-pay | Admitting: *Deleted

## 2010-12-16 DIAGNOSIS — K5909 Other constipation: Secondary | ICD-10-CM

## 2010-12-16 NOTE — Telephone Encounter (Signed)
Okay to refer her to Dr. Juanda Chance  for constipation issues

## 2010-12-16 NOTE — Telephone Encounter (Signed)
Pt's wife would like Dr. Kirtland Bouchard to refer pt to Dr. Juanda Chance for constipation.  He still has problems constantly with moving his bowels and she is afraid of a blockage.

## 2010-12-17 ENCOUNTER — Telehealth: Payer: Self-pay | Admitting: Internal Medicine

## 2010-12-17 NOTE — Telephone Encounter (Signed)
History with Dr Juanda Chance, family reports patient with a history of gastric CA, severe constipation and abdominal pain.  He is scheduled to see Mike Gip PA 12/18/10 2:00

## 2010-12-17 NOTE — Telephone Encounter (Signed)
Spoke with joyce - informed that Dr. Amador Cunas has ok'd GI referral - terro will schedule and then call.  Order placed. KIK

## 2010-12-17 NOTE — Telephone Encounter (Signed)
Pts wife called to check on status of getting referral appt with Dr Juanda Chance. Pts condition is very serious. Past family hx of stomach cancer. Pt needs to get this referral appt asap.

## 2010-12-18 ENCOUNTER — Ambulatory Visit (INDEPENDENT_AMBULATORY_CARE_PROVIDER_SITE_OTHER): Payer: Medicare Other | Admitting: Physician Assistant

## 2010-12-18 ENCOUNTER — Encounter: Payer: Self-pay | Admitting: Physician Assistant

## 2010-12-18 DIAGNOSIS — K5900 Constipation, unspecified: Secondary | ICD-10-CM

## 2010-12-18 DIAGNOSIS — R195 Other fecal abnormalities: Secondary | ICD-10-CM

## 2010-12-18 MED ORDER — PEG-KCL-NACL-NASULF-NA ASC-C 100 G PO SOLR: ORAL | Status: DC

## 2010-12-18 NOTE — Patient Instructions (Signed)
We have given you Miralax samples, take 17 grams in water or clear juice daily. Samples given. We scheduled the colonoscopy with Dr. Juanda Chance on 12-31-2010. Colonoscopy A colonoscopy is an exam to evaluate your entire colon. In this exam, your colon is cleansed. A long fiberoptic tube is inserted through your rectum and into your colon. The fiberoptic scope (endoscope) is a long bundle of enclosed and very flexible fibers. These fibers transmit light to the area examined and send images from that area to your caregiver. Discomfort is usually minimal. You may be given a drug to help you sleep (sedative) during or prior to the procedure. This exam helps to detect lumps (tumors), polyps, inflammation, and areas of bleeding. Your caregiver may also take a small piece of tissue (biopsy) that will be examined under a microscope. BEFORE THE PROCEDURE  A clear liquid diet may be required for 2 days before the exam.   Liquid injections (enemas) or laxatives may be required.   A large amount of electrolyte solution may be given to you to drink over a short period of time. This solution is used to clean out your colon.   Check in at the admissions desk to fill out necessary forms if not preregistered. There will be consent forms to sign prior to the procedure. If accompanied by friends or family, there is a waiting area for them while you are having your procedure.  LET YOUR CAREGIVER KNOW ABOUT:  Allergies to food or medicine.  Medicines taken, including vitamins, herbs, eyedrops, over-the-counter medicines, and creams.   Use of steroids (by mouth or creams).   Previous problems with anesthetics or numbing medicines.   History of bleeding problems or blood clots.  Previous surgery.   Other health problems, including diabetes and kidney problems.   Possibility of pregnancy, if this applies.   AFTER THE PROCEDURE  If you received a sedative and/or pain medicine, you will need to arrange for someone  to drive you home.   Occasionally, there is a little blood passed with the first bowel movement. DO NOT be concerned.  HOME CARE INSTRUCTIONS  It is not unusual to pass moderate amounts of gas and experience mild abdominal cramping following the procedure. This is due to air being used to inflate your colon during the exam. Walking or a warm pack on your belly (abdomen) may help.   You may resume all normal meals and activities after sedatives and medicines have worn off.   Only take over-the-counter or prescription medicines for pain, discomfort, or fever as directed by your caregiver. DO NOT use aspirin or blood thinners if a biopsy was taken. Consult your caregiver for medicine usage if biopsies were taken.  FINDING OUT THE RESULTS OF YOUR TEST Not all test results are available during your visit. If your test results are not back during the visit, make an appointment with your caregiver to find out the results. Do not assume everything is normal if you have not heard from your caregiver or the medical facility. It is important for you to follow up on all of your test results. SEEK IMMEDIATE MEDICAL CARE IF:  You pass large blood clots or fill a toilet with blood following the procedure. This may also occur 10 to 14 days following the procedure. This is more likely if a biopsy was taken.   You develop abdominal pain that keeps getting worse and cannot be relieved with medicine.  Document Released: 08/08/2000 Document Re-Released: 11/05/2009 [employer] Patient Information 2011  [employer].

## 2010-12-19 ENCOUNTER — Encounter: Payer: Self-pay | Admitting: Physician Assistant

## 2010-12-19 ENCOUNTER — Encounter: Payer: Self-pay | Admitting: Internal Medicine

## 2010-12-19 NOTE — Progress Notes (Signed)
Subjective:    Patient ID: Zachary Ball, male    DOB: [birth year], 77 y.o.   MRN: [id number]  HPI Zachary Ball is an 77 year old white male known remotely to Dr. Lina Sar  from previous colonoscopy which was done in 2005.                                 This was done for routine screening and was normal. Patient comes in today for evaluation of progressive constipation and rectal pain. He and his wife relate that he has had chronic problems with constipation for years which has been managed with stool softeners. He was sick earlier in the winter and had a pneumonia and says that ever since then he has been having severe problems with constipation. He feels that his stool caliber has changed as well and that he is having flatter narrower stools. He says he has a lot of straining on a regular basis and is only able to have a bowel movement every 3-4 days. He has not seen any blood that he is aware of. He has no real complaint of abdominal pain. His appetite has been fine and his weight has been relatively stable. He has been using enemas on a when necessary basis and used enemas today before this visit. He is very concerned that he has a blockage in his rectum because of the flat nature of his stools  He has not started on any new medications supplements etc. Duffy Rhody history is positive for an abdominal cancer in his mother. He also reports that he has remote history of colon polyps and had some sort of polyp removal in the 1950s while he was in the [employer].    Review of Systems  Constitutional: Negative.   HENT: Negative.   Eyes: Negative.   Respiratory: Negative.   Cardiovascular: Negative.   Gastrointestinal: Positive for constipation and rectal pain.  Genitourinary: Negative.   Musculoskeletal: Positive for arthralgias.  Skin: Negative.   Neurological: Negative.   Hematological: Negative.   Psychiatric/Behavioral: Negative.    Outpatient Prescriptions Prior to Visit  Medication Sig Dispense  Refill  . aspirin 81 MG tablet 81 mg daily. Only takes on Monday, Wed, and Friday      . Glucosamine-Chondroitin (GLUCOSAMINE CHONDR COMPLEX PO) Take by mouth daily.        . Multiple Vitamin (MULTIVITAMIN) tablet Take 1 tablet by mouth daily.        Marland Kitchen nystatin-triamcinolone (MYCOLOG II) cream Apply topically as directed.        . simvastatin (ZOCOR) 20 MG tablet Take 20 mg by mouth at bedtime. 1/2 tablet      . triamcinolone (KENALOG) 0.1 % cream Apply topically 2 (two) times daily as needed.            No Known Allergies  Objective:   Physical Exam a well-developed elderly white male in no acute distress pleasant, alert and oriented x3, HEENT nontraumatic normocephalic EOMI PERRLA sclera anicteric  Neck; supple no JVD  Cardiovascular; regular rate and rhythm with S1 and S2 soft systolic murmur   Pulmonary; clear bilaterally  Abdomen; soft nondistended bowel sounds active no palpable mass or hepatosplenomegaly, nontender  Rectal exam; scant stool Hemoccult negative no impaction mass or lesion felt.        Assessment & Plan:  #50 77 year old white male with history of chronic constipation with abrupt worsening x2-3 weeks associated with flat narrow  stools and rectal discomfort. Rule out exacerbation of chronic constipation, rule out all cold colorectal lesion.  Plan; start MiraLax 17 g in 8 ounces of water daily. Discussed trial of bowel purge with the patient and his wife however he is quite concerned that he has a blockage and wants to proceed with more definitive management. We will schedule for colonoscopy with Dr. Lina Sar. Procedure has been discussed in detail with the patient and his wife.

## 2010-12-19 NOTE — Progress Notes (Signed)
Reviewed and agree with management. Kortnee Bas E. Leva Baine, MD, FACG  

## 2010-12-31 ENCOUNTER — Ambulatory Visit (AMBULATORY_SURGERY_CENTER): Payer: Medicare Other | Admitting: Internal Medicine

## 2010-12-31 ENCOUNTER — Encounter: Payer: Self-pay | Admitting: Internal Medicine

## 2010-12-31 DIAGNOSIS — K648 Other hemorrhoids: Secondary | ICD-10-CM

## 2010-12-31 DIAGNOSIS — Z1211 Encounter for screening for malignant neoplasm of colon: Secondary | ICD-10-CM

## 2010-12-31 DIAGNOSIS — K921 Melena: Secondary | ICD-10-CM

## 2010-12-31 DIAGNOSIS — R195 Other fecal abnormalities: Secondary | ICD-10-CM

## 2010-12-31 DIAGNOSIS — K5900 Constipation, unspecified: Secondary | ICD-10-CM

## 2010-12-31 MED ORDER — SODIUM CHLORIDE 0.9 % IV SOLN: 500.0000 mL | INTRAVENOUS | Status: DC

## 2010-12-31 NOTE — Patient Instructions (Signed)
Internal hemorrhoids High fiber diet Start Miralax 1/2 to 1 capful 2-3 times / week for constipation

## 2010-12-31 NOTE — Progress Notes (Signed)
Dr. Lina Sar gave pt 14:37 & 14:38 dose of medication.MAW

## 2011-01-01 ENCOUNTER — Telehealth: Payer: Self-pay | Admitting: *Deleted

## 2011-01-01 NOTE — Telephone Encounter (Signed)

## 2011-01-02 ENCOUNTER — Encounter: Payer: No Typology Code available for payment source | Admitting: Gastroenterology

## 2011-01-07 NOTE — Assessment & Plan Note (Signed)
[facility]                            CARDIOLOGY OFFICE NOTE   CHEROKEE, CLOWERS                      MRN:          [id number]  DATE:03/02/2007                            DOB:          1925/06/08    PRIMARY CARE PHYSICIAN:  Gordy Savers, MD   REASON FOR PRESENTATION:  Evaluate patient with coronary disease.   HISTORY OF PRESENT ILLNESS:  The patient returns for followup. In the  last year he has done well. He exercises daily. He walks. He does some  light weights. With this, he denies any chest discomfort, neck  discomfort, arm discomfort, activity induced nausea, vomiting,  diaphoresis. He has had no palpitations, pre-syncope or syncope. He has  some mild dyspnea, which is unchanged from last year. At that time, he  did have a stress perfusion study, which demonstrated no ischemia. He  had an echocardiogram demonstrating mild AS. He had a normal BNP level.   PAST MEDICAL HISTORY:  1. Coronary artery disease, status post coronary artery bypass graft      (Left internal mammary artery (LIMA) to the left anterior      descending artery (LAD); saphenous vein graft (SVG) to first      diagonal; saphenous vein graft (SVG) to obtuse marginal; saphenous      vein graft (SVG) to posterior descending artery (PDA).  2. Right knee surgery in 1994 and 1999.  3. Shoulder surgery in 1975.   ALLERGIES:  None.   MEDICATIONS:  1. Multivitamin.  2. Aspirin 81 mg daily.  3. Glucosamine.  4. Zocor 20 mg daily.  (Lisinopril was stopped in the past year because of orthostatic  symptoms.)   REVIEW OF SYSTEMS:  As stated in the HPI and otherwise negative for  other systems.   PHYSICAL EXAMINATION:  The patient is in no distress. Blood pressure  110/62, heart rate 69 and regular.  HEENT: Eyelids unremarkable. Pupils equal, round, and reactive to light.  Fundi not visualized.  NECK: No jugular venous distention at 45 degrees. Carotid upstroke brisk  and symmetrical. No bruits, no thyromegaly.  LYMPHATICS: No adenopathy.  LUNGS: Clear to auscultation bilaterally.  BACK: No costovertebral angle tenderness.  CHEST: Well-healed sternotomy.  HEART: PMI not displaced or sustained. S1, S2 within normal limits. No  S3. No S4. A 2/6 apical systolic murmur radiating out the aortic outflow  tract and up to the carotids. No diastolic murmurs.  ABDOMEN: Flat, positive bowel sounds, normal in frequency and pitch. No  bruits. No rebounds. No guarding. No midline pulsatile mass. No  organomegaly.  SKIN: No rashes, no nodules.  EXTREMITIES: 2+ pulses. No edema.   EKG: Sinus rhythm, rate 69, axis within normal limits, intervals within  normal limits, no acute ST-T wave changes.   ASSESSMENT/PLAN:  1. Coronary disease. The patient is having no new symptoms. No further      cardiovascular testing is suggested. He is participating in      secondary risk reduction.  2. Dyspnea. This is unchanged. I do not see a cardiac etiology. No      further evaluation  is warranted.  3. Aortic stenosis. The patient had mild aortic stenosis. This has not      changed clinically. This can be followed with clinical examinations      and symptoms.  4. Risk reduction. He is having his lipids followed by Dr.      Amador Cunas. The goal is an LDL of less than 100 and HDL greater      than 40.  5. Followup. Will see the patient back in one year or sooner if      needed.     Rollene Rotunda, MD, [facility]  Electronically Signed    JH/MedQ  DD: 03/02/2007  DT: 03/02/2007  Job #: [id number]   cc:   Gordy Savers, MD

## 2011-01-07 NOTE — Assessment & Plan Note (Signed)
[facility]                            CARDIOLOGY OFFICE NOTE   Zachary Ball, Zachary Ball                      MRN:          [id number]  DATE:03/02/2008                            DOB:          06-15-1925    PRIMARY CARE PHYSICIAN:  Zachary Savers, MD   REASON FOR PRESENTATION:  Evaluate the patient with coronary artery  disease.   HISTORY OF PRESENT ILLNESS:  The patient is a very pleasant 77 year old  gentleman with a past cardiac history as described below.  He returns  for a yearly followup.  He has done well since I last him.  He continues  to exercise almost daily.  He walks and does some light weights.  With  this level of activity, he denies any chest discomfort, neck or arm  discomfort.  He has had no palpitations, presyncope, or syncope.  He has  had no PND or orthopnea.  He has had his lipids followed by Dr.  Amador Ball.   PAST MEDICAL HISTORY:  1. Coronary artery disease, status post CABG (LIMA to the LAD, SVG to      first diagonal, SVG to obtuse marginal, and SVG to the PDA).  2. Right knee surgery in 1994 and 1999.  3. Shoulder surgery in 1975.  4. Mild aortic stenosis on echocardiogram in 2007.   ALLERGIES:  None.   MEDICATIONS:  1. Multivitamin.  2. Aspirin 81 mg daily.  3. Zocor 20 mg daily.  4. Glucosamine.   REVIEW OF SYSTEMS:  As stated in the HPI and otherwise negative for  other systems.   PHYSICAL EXAMINATION:  GENERAL:  The patient is in no distress.  VITAL SIGNS:  Blood pressure 150/80, heart rate 65 and regular, and  weight 220 pounds.  HEENT:  Eyes unremarkable.  Pupils equal, round, and reactive to light.  Fundi not visualized.  Oral mucosa unremarkable, excellent dentition.  NECK:  No jugular venous distention at 45 degrees.  Carotid upstroke  brisk and symmetric.  Bilateral carotid bruits.  No thyromegaly.  LYMPHATICS:  No cervical, axillary, or inguinal lymphadenopathy.  LUNGS:  Clear to auscultation  bilaterally.  BACK:  No costovertebral angle tenderness.  CHEST:  A well-healed sternotomy scar.  HEART:  PMI not displaced or sustained.  S1 and S2 within normal limits.  No S3, no S4, no clicks, no rubs, 2/6 atypical systolic  murmur  radiating slightly at the aortic outflow tract, and no diastolic  murmurs.  ABDOMEN:  Mildly obese, positive bowel sounds, normal in frequency and  pitch.  No bruits, rebound, or guarding.  No midline pulsatile mass.  No  hepatosplenomegaly.  SKIN:  No rashes, no nodules.  EXTREMITIES:  Pulses are 2+ throughout.  Mild bilateral ankle and mid  calf edema.  NEURO:  Oriented to person, place, and time.  Cranial nerves II through  XII grossly intact, and motor grossly intact.   EKG, sinus rhythm, rate 65, axis within normal limits, intervals within  normal limits, and no acute ST-T wave changes.   ASSESSMENT AND PLAN:  1. Coronary artery disease.  The patient is  having no ongoing      symptoms.  He had a stress perfusion study in 2007.  No further      cardiovascular testing is suggested.  He will continue with      aggressive risk reduction.  2. Carotid bruits.  The patient does have bilateral carotid bruits.      We will go ahead and order carotid Dopplers.  He had mild plaque      at the time of his bypass surgery and has not had a study since      then.  3. Hypertension.  His blood pressure is elevated today.  However, it      typically has been in the 120s.  He checks it at home occasionally.      He gets it checked at Dr. Vernon Ball office.  I have asked him      to keep a blood pressure diary for the next week and to let me know      of these readings.  We may need the dosage of his medicines.  4. Dyslipidemia per Dr. Amador Ball.  I will check his most recent      lipid profile getting it from their office.  Goal being LDL less      than 100 and HDL greater than 40.  5. Obesity.  The patient is gaining weight slowly over the years and I       discussed this with him.  Hopefully, he can reverse this trend.  6. Followup.  I will see him back in 1 year or sooner if needed.     Zachary Rotunda, MD, [facility]  Electronically Signed    JH/MedQ  DD: 03/02/2008  DT: 03/03/2008  Job #: [id number]   cc:   Zachary Savers, MD

## 2011-01-10 NOTE — Discharge Summary (Signed)
NAME:  [facility], Zachary Ball NO.:  [id number]   MEDICAL RECORD NO.:  [id number]                   PATIENT TYPE:  INP   LOCATION:  2029                                 FACILITY:  [facility]   PHYSICIAN:  Salvatore Decent. Dorris Fetch, M.D.         DATE OF BIRTH:  10-05-1924   DATE OF ADMISSION:  03/22/2002  DATE OF DISCHARGE:  03/29/2002                                 DISCHARGE SUMMARY   CARDIOLOGIST:  Rollene Rotunda, M.D. [facility]   PRIMARY CARE PHYSICIAN:  Gordy Savers, M.D. [facility]   ADMISSION DIAGNOSIS:  Progressive chest pain.   FINAL DIAGNOSIS:  1. Unstable angina.  2. Severe 3-vessel coronary artery disease with normal ejection fraction.  3. Chronic bradycardia.  4. Hyperlipidemia.  5. Degenerative joint disease.   PROCEDURE:  1. Cardiac cath on March 22, 2002.  2. Pre-CABG dopplers on March 22, 2002, showing no ICS stenosis and normal     AVIs.  3. CABG x4 with endoscopic vein harvest on March 24, 2002, with the following     grafts, LIMA to the LAD, saphenous vein graft to PDA, saphenous vein     graft to circumflex, saphenous vein graft to diagonal.   HISTORY OF PRESENT ILLNESS:  The patient is a 76 year old white male, former  smoker, with a history of hypercholesterolemia who presented initially with  symptoms of angina.  He had a cardiac cath which showed severe 3-vessel  coronary disease and preserved left ventricular function.  CVTS was  consulted.  Dr. Dorris Fetch recommended CABG.  Risks, benefits, details and  alternatives were discussed.  It was agreed to proceed.  He underwent the  procedure on March 24, 2002.  The vein was adequate but small caliber.  Additional vein had to be harvested from the left thigh.  He was transferred  to __________  in stable condition.   On postop day 1, he was doing well and was transferred to unit 2000.  He had  no major complications.  He did require a few interrupted sutures to his  left thigh postop day 2  because of edema and the wound had opened up  slightly.  He was maintaining normal sinus rhythm, walking with cardiac  rehab.  He was diuresing well.  His heart rate was in the upper 50s and 60s  and his beta blocker was DCd.  He also has chronic bradycardia before  admission.  By postop day 5, he felt well.  He had no complaints.  He was in  a normal sinus rhythm.  He was off O2.  Physical exam was satisfactory.  He  was discharged home in stable condition.   MEDICATIONS:  1. Lasix 40 mg, 1 p.o. q.d. x 7 days.  2. KCl 20 mEq 1 p.o. q.d. x 7 days.  3. Keflex 500 mg 1 p.o. q.8h. for one week prophylactically for leg wound.  4. Enteric-coated aspirin 325 mg, 1 p.o. q.d.  5. Percocet 1-2 every 4-6 hours p.r.n.  6. Multivitamin 1 p.o. q.d.   ALLERGIES:  No known drug allergies.   CONDITION ON DISCHARGE:  Stable.   DISPOSITION:  Home.   SPECIAL INSTRUCTIONS:  He was told to avoid driving, working, heavy lifting,  strenuous activity.  He was told he could shower and to clean his wounds  gently daily with soap and water and to call the office if he had any  problems with his wounds.  He was told to get a chest x-ray when he saw Dr.  Antoine Poche and to bring it with him to see Dr. Dorris Fetch.  Home Health RN  was arranged to DC his leg sutures one week after discharge.   FOLLOW UP:  1. Dr. Antoine Poche April 12, 2002, at 4:45 p.m.  2. Dr. Dorris Fetch, Wednesday April 20, 2002, at 12:15 p.m.     Jody P. Diamond Nickel.                       Salvatore Decent Dorris Fetch, M.D.    JPL/MEDQ  D:  04/08/2002  T:  04/12/2002  Job:  [id number]   cc:   Rollene Rotunda, M.D. [facility]   Gordy Savers, M.D. [facility]   CVTS Office

## 2011-01-10 NOTE — Assessment & Plan Note (Signed)
[facility] OFFICE NOTE   RICHEY, DOOLITTLE                      MRN:          [id number]  DATE:05/05/2006                            DOB:          Sep 11, 1924    A 77 year old gentleman who was seen today for a wellness exam.  He has a  history of coronary artery disease, status post CABG in 2003.  He has had a  recent cardiac evaluation due to dyspnea on exertion.  This was negative for  cardiac disease.  He does quite well and remains quite active.   PAST MEDICAL HISTORY:  1. He has a history of BPH.  2. Gastroesophageal reflux disease.  3. DJD.   PAST SURGICAL HISTORY:  1. Right shoulder surgery in the past.  2. Right knee surgery in the past.   FAMILY HISTORY:  Positive for coronary artery disease.  Father died at age  71.  Also positive for diabetes.  Mother with breast cancer.   PHYSICAL EXAMINATION:  GENERAL:  Well-developed, mildly overweight male in  no acute distress.  VITAL SIGNS:  Blood pressure was 120/78.  HEENT:  Unremarkable.  Hearing aids in place bilaterally.  No wax.  Oropharynx clear.  NECK:  No bruits in the carotid distribution.  There was a left  supraclavicular bruit.  CHEST:  Clear.  CARDIOVASCULAR:  A well-healed sternotomy scar.  There is a grade 2/6  systolic murmur heard at the base and apex.  ABDOMEN:  Benign.  No organomegaly.  GENITALIA:  External genitalia normal.  RECTAL:  Heme negative.  EXTREMITIES:  Unremarkable without edema.  Peripheral pulses were full.  NEUROLOGIC:  Negative.   Intact monofilament testing.   IMPRESSION:  1. Coronary artery disease.  2. Degenerative joint disease.  3. Gastroesophageal reflux disease.  4. Benign prostatic hypertrophy.   DISPOSITION:  The patient's medical regimen will be unchanged.  Gradual  exercise regimen recommended.  Return here in 6 months for followup.                                   Gordy Savers,  MD   PFK/MedQ  DD:  05/05/2006  DT:  05/05/2006  Job #:  [id number]

## 2011-01-10 NOTE — Cardiovascular Report (Signed)
   NAME:  [facility], Zachary Ball                         ACCOUNT NO.:  [id number]   MEDICAL RECORD NO.:  [id number]                   PATIENT TYPE:  OIB   LOCATION:  3709                                 FACILITY:  [facility]   PHYSICIAN:  Noralyn Pick. Eden Emms, M.D. [facility]           DATE OF BIRTH:  Jul 28, 1925   DATE OF PROCEDURE:  DATE OF DISCHARGE:                              CARDIAC CATHETERIZATION   INDICATIONS FOR PROCEDURE:  Recurrent chest pain with abnormal Cardiolite  study and positive EKG response.   DESCRIPTION OF PROCEDURE:  Ascending catheterization was done from the right  femoral artery.  The left main coronary artery had a 70% distal stenosis.   The left anterior descending artery had 50% diffuse disease in the  midportion.  There was a 60% discrete lesion distally.  The first diagonal  branch had a 50-60% ostial lesion.   The circumflex coronary artery had 30% proximal lesions.  There was a large  obtuse marginal branch with an 80% proximal lesion.  This obtuse marginal  branch bifurcated, and the inferior branch had a 50% tubular disease.   The right coronary artery was dominant but small.  There was a 40% midvessel  lesion at the takeoff of the RV branch.   RAO ventriculography was normal with an EF of 60%.  There was no gradient  across the aortic valve and no MR.  Aortic pressure was 160/70, LV pressure  was 162/90.   IMPRESSION:  I will review the films with Dr. Antoine Poche, but I suspect that  we will recommend coronary artery bypass grafting due to his left main  disease.                                               Noralyn Pick. Eden Emms, M.D. [facility]    PCN/MEDQ  D:  03/22/2002  T:  03/28/2002  Job:  [id number]   cc:   Gordy Savers, M.D. [facility]   Rollene Rotunda, M.D. [facility]

## 2011-01-10 NOTE — Op Note (Signed)
NAME:  Zachary Ball, Zachary Ball NO.:  [id number]   MEDICAL RECORD NO.:  [id number]                   PATIENT TYPE:  INP   LOCATION:  2029                                 FACILITY:  [facility]   PHYSICIAN:  Salvatore Decent. Dorris Fetch, M.D.         DATE OF BIRTH:  [birth year]   DATE OF PROCEDURE:  03/24/2002  DATE OF DISCHARGE:  03/29/2002                                 OPERATIVE REPORT   PREOPERATIVE DIAGNOSES:  Left main coronary disease with stable angina.   POSTOPERATIVE DIAGNOSES:  Left main coronary disease with stable angina.   OPERATION PERFORMED:  Median sternotomy, extracorporeal circulation,  coronary artery bypass grafting times four (left internal mammary artery to  left anterior descending, saphenous vein graft to first diagonal, saphenous  vein graft to obtuse marginal 1, saphenous vein graft to distal posterior  descending), endoscopic vein harvest from right thigh.   SURGEON:  Salvatore Decent. Dorris Fetch, M.D.   ASSISTANT:  Jody P. Diamond Nickel.   ANESTHESIA:  General.   FINDINGS:  LAD diffusely diseased, poor quality vessel.  Distal posterior  descending small, poor quality vessel.  Diagonal and OM1 both large good  quality vessels.  Vein small but of good quality, mammary artery of good  quality.   INDICATIONS FOR PROCEDURE:  [name] is a 77 year old gentleman who  underwent work-up for progressive angina.  Cardiac catheterization revealed  a 70% left main stenosis.  He also had diffusely diseased LAD, ostial  disease in the first diagonal, 80% stenosis in obtuse marginal 1.  His right  coronary artery only had a 30 to 40% narrowing in the midvessel but his  posterior descending had a near total obstruction in its midportion.  The  patient was referred for coronary artery bypass grafting.  The indications,  risks, benefits and alternatives were discussed in detail with the patient.  He understood and accepted the risks of coronary artery bypass  grafting for  indication of left main disease, accepted the risks and agreed to proceed.   DESCRIPTION OF PROCEDURE:  The patient was brought to the preop holding area  on March 24, 2002.  Lines were placed to monitor arterial, central venous and  pulmonary arterial pressure.  EKG leads were placed for continuous  telemetry.  The patient was taken to the operating room, anesthetized and  intubated.  A Foley catheter was placed, intravenous antibiotics were  administered.  The chest, abdomen and legs were prepped and draped in the  usual fashion.   A median sternotomy was performed.  The left internal mammary artery was  harvested using standard technique.  Simultaneously, an incision was made in  the medial aspect of the right leg at the knee over the saphenous vein which  had been marked with ultrasound.  The saphenous vein was harvested from the  right thigh endoscopically.  This turned out to be adequate vein, but it was  small caliber and not  suitable for use with __________ grafting.  Additional  vein was also harvested from the left groin using standard technique.  The  patient was fully heparinized prior to dividing the distal end of the  mammary artery.  There was good flow through the cut end of the vessel.   The pericardium was opened.  The ascending aorta was inspected and palpated.  There was no palpable atherosclerotic disease.  The aorta was cannulated via  concentric 2-0 Ethibond pursestring sutures.  A dual stage venous cannula  was placed via pursestring suture in the right atrial appendage.  Cardiopulmonary bypass was instituted and the patient was cooled to 32  degrees Celsius.  The coronary arteries were inspected and anastomotic sites  were chosen.  The conduits were inspected and cut to length.  A foam pad was  placed in the pericardium to protect the left phrenic nerve.  A temperature  probe was placed in the myocardial septum and a cardioplegia cannula was  placed  in the ascending aorta.   The aorta was crossclamped, the left ventricle was emptied via the aortic  root vent.  Cardiac arrest was then achieved with a combination of cold  antegrade blood cardioplegia and topical iced saline.  After achieving a  complete diastolic arrest and adequate myocardial septal cooling, the  following distal anastomosis were performed.   First, a reversed saphenous vein graft was placed end-to-side to the distal  aspect of the posterior descending coronary artery.  This was a heavily  diseased vessel in its midportion.  It was a 1 mm poor quality target at the  site of the anastomosis.  The anastomosis was performed with a running 7-0  Prolene suture in end-to-side fashion.  There was good flow through the  graft.  Cardioplegia was administered.  There was good hemostasis.   Next, a reversed saphenous vein graft was placed end-to-side to obtuse  marginal 1.  This was the larger of the two obtuse marginal branches.  It  was 2 mm and of good quality.  An end-to-side anastomosis was performed with  a running 7-0 Prolene suture.  Again the vein was small but of good quality.  There was excellent flow through the graft at the completion of the  anastomosis.  There was good hemostasis.   Next, a reversed saphenous vein was placed end-to-side to the first diagonal  branch of the LAD.  This was a 2 mm good quality target.  The vein graft was  small but without sclerosis or varicosities.  The anastomosis was performed  end-to-side with a running 7-0 Prolene suture.  There was excellent flow  through this graft.  Cardioplegia was administered and again there was good  hemostasis.   Next, the left internal mammary artery was brought through a window in the  pericardium.  The distal end was spatulated.  An arteriotomy was made in the  LAD.  In its distal portion there was a focal approximately 70% stenosis. The arteriotomy was made distal to this and extended back  proximally across  the area of stenosis to a point that was relatively free of disease.  A 1.5  mm probe passed easily both proximally and distally from this point.  A long  segment end-to-side anastomosis then was performed with a running 8-0  Prolene suture.  At completion of the anastomosis it was probed both  proximally and distally to ensure patency.  The bulldog clamp was briefly  removed and inspected for hemostasis which was  excellent.  The bulldog clamp  was replaced.  Immediate and rapid septal rewarming did occur in that time  frame and additional cardioplegia was administered.   The vein grafts then were cut to length.  The proximal anastomoses for the  diagonal and posterior descending grafts were performed to 4.0 mm punch  aortotomies with running 6-0 Prolene sutures.  A side branch of the diagonal  vein graft near the aortic anastomosis was excised and an end-to-side vein  to vein anastomosis was performed for the proximal site of the obtuse  marginal graft.  This was done using a running 7-0 Prolene suture.  The  bulldog clamp was once again removed from the mammary artery.  Again rapid  septal rewarming was noted.  Lidocaine was administered.  The aortic root  was deaired.  The patient was placed in Trendelenburg position and the  aortic crossclamp was removed.  The total crossclamp time was 83 minutes.   The patient was rewarmed.  All proximal and distal anastomoses were  inspected for hemostasis.  Epicardial pacing wires were placed on the right  ventricle and right atrium.  When the core temperature reached 37 degrees  Celsius, the patient was weaned from cardiopulmonary bypass.  He was  atrially paced for rates at time of separation from bypass, but on no  inotropic support.  Total bypass time was 165 minutes.  The initial cardiac  index was greater than 2L per minute per meter squared.  The patient  remained hemodynamically stable throughout the post bypass  period.   A test dose of protamine was administered and was well tolerated.  The  atrial and aortic cannula were removed.  The remainder of the protamine was  administered without incident. The chest was irrigated with 1L of warm  normal saline containing 1 gm of vancomycin.  Hemostasis was achieved.  The  pericardium was reapproximated with interrupted 3-0 silk sutures.  It came  together easily without tension.  The left pleural and two mediastinal chest  tubes were placed through separate subcostal incisions.  The sternum was  closed with heavy gauge interrupted stainless steel wires.  The remainder of  the incisions were closed in a standard fashion with subcuticular skin  closures.   At the completion of the procedure, the sponge and instrument counts were  correct.  There was a missing needle from a sternal wire and x-rays were  performed.  The needle was ultimately located on the instrument table.  The patient then was transported from the operating room to the surgical  intensive care unit in critical but stable condition.                                                Salvatore Decent Dorris Fetch, M.D.    SCH/MEDQ  D:  03/24/2002  T:  03/30/2002  Job:  [id number]   cc:   Rollene Rotunda, M.D. [facility]   Gordy Savers, M.D. [facility]

## 2011-01-10 NOTE — Op Note (Signed)
NAME:  Zachary Ball, Zachary Ball NO.:  [id number]   MEDICAL RECORD NO.:  [id number]                   PATIENT TYPE:  AMB   LOCATION:  DSC                                  FACILITY:  [facility]   PHYSICIAN:  Dionne Ano. Everlene Other, M.D.         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   PROCEDURE:  1. Arthroplasty (trapezium removal) base of the thumb joint, right thumb.  2. Tendon transfer in the form of adductor pollicis longus digastric portion     tendon transfer to the first metacarpal around the FCR back upon itself     on the APL proper (Zancolli tendon transfer/suspension) right thumb.  3. APL 1/3 proper portion tendon transfer to the FCR and APL proper and back     upon themselves with multiple figure-of-eight throws (Weilby tendon     transfer/suspension) right thumb.  4. Abductor pollicis longus tenodesis (shortening of her wrist extensor).   SURGEON:  Dionne Ano. Amanda Pea, M.D.   ASSISTANT:  Karie Chimera, P.A.-C.   COMPLICATIONS:  None.   ANESTHESIA:  Regional block with IV sedation.   TOURNIQUET TIME:  Less than an hour.   ESTIMATED BLOOD LOSS:  Minimal.   SPECIMENS:  None.   INDICATIONS FOR PROCEDURE:  The patient is a 77 year old male who presents  with the above mentioned diagnosis.  I have counseled him in regards to the  risks and benefits of surgery including the risks of infection, bleeding,  anesthesia, damage to neural structures, and failure of surgery to  accomplish the intended.  With this in mind, he desires to proceed.  All  questions were encouraged and answered preoperatively.   PROCEDURE IN DETAIL:  The patient was taken to the operating suite,  underwent prophylactic antibiotic administration  and block anesthesia was in  excellent working fashion.  He was laid supine, appropriately padded, given  sedation as necessary, and underwent a Betadine scrub and paint prep  followed by sterile drape.  Following this, the arm was elevated and the  tourniquet was inflated to 250 mmHg.  An incision was made dorsal radially  about the thumb.  Dissection was carried down sharply with the knife.  The  interval between the EPB and APL was identified and gained.  The capsule was  then split.  A Freer elevator was placed on either side of this region.  These were then excised piecemeal without difficulty.  All remnants were  removed.  The FCR underwent a tenolysis and mobilization technique during  this.  This completed the arthroplasty trapezium removal portion of the  procedure.  Following this, the patient underwent correction dorsal to  palmar extending articularly in line with the palmar orbicular  ligament.  This was enlarged to a 35 drill bit.  I should note that the superficial  radial nerve and radial artery were identified and protected during all  portions of the case.  Following this, the patient then had the APL  digastric portion and APL proper identified.  A 1/3 split was made in the  APL proper.  A counter incision was made 2 inches proximal toward the dorsal  radial incision and this allowed access to the tendons.  The digastric  portion of the APL and the 1/3 portion of the APL were clipped proximally at  their muscular junction and retracted distally and kept attached to the  first metacarpal.  Following this, the patient underwent tendon transfer.   Tendon transfer of the APL digastric portion against the metacarpal through  the drill hole around the FCR 3/4 portion and back upon itself with  FiberWire used to inset it was then performed.  This completed the Zancolli  tendon transfer.  Following this, the APL proper portion 1/3 tendon strip  was then placed around the APL  proper then around the FCR and then back upon  themselves with multiple figure-of-eight throws inset with 4-0 FiberWire  completing the Weilby tendon transfer/suspension.  The patient tolerated  this well and had excellent suspension.  I then performed an APL tenodesis  utilizing the capsule and APL and harnessing this with FiberWire and this  was the short end of the wrist extensor with the wrist level.  Following  this, the thumb was checked.  He had excellent refill, no MCP  hyperextension, excellent stability, and I was very pleased with the  operation.  The tourniquet was deflated, the compartments were soft.  He  underwent copious irrigation followed by closure of the wound with  interrupted Prolene.  The patient had a thumb spica splint and sterile  dressing placed, of course.  He as taken to the recovery room.  He will be  monitored overnight and discharged home in the morning if appropriate.  It  has been an absolute pleasure to participate in his care and I look forward  to his postoperative recovery.                                               Dionne Ano. Everlene Other, M.D.    Nash Mantis  D:  01/11/2004  T:  01/12/2004  Job:  [id number]

## 2011-01-10 NOTE — Assessment & Plan Note (Signed)
[facility]                              CARDIOLOGY OFFICE NOTE   HATCHER, FRONING                      MRN:          [id number]  DATE:03/06/2006                            DOB:          11/14/1924    PRIMARY:  Dr. Amador Cunas   REASON FOR PRESENTATION:  Evaluate patient with dyspnea.   HISTORY OF PRESENT ILLNESS:  Patient returns for follow-up of the above.  He  had a history of one month of dyspnea when I saw him in May.  However,  stress perfusion study done at that time demonstrated an EF of 66% with no  evidence of ischemia or infarction.  He continues, however, to be dyspneic  with activities that did not use to cause this.  He tries to walk 20-45  minutes on his treadmill daily.  He may get a little bit of short of breath  with this.  Seems to be dyspnea with moderately strenuous activity and not  at rest.  He is not having any PND or orthopnea.  He is not having any  palpitation or presyncope or syncope.  He is not having any chest  discomfort, neck discomfort, arm discomfort, activity-induced nausea,  vomiting, excessive diaphoresis.   PAST MEDICAL HISTORY:  1.  Coronary artery disease status post CABG (LIMA to the LAD, SVG to first      diagonal, SVG to obtuse marginal, SVG to PDA).  2.  Right knee surgery 1994, 1999.  3.  Shoulder surgery 1975.   ALLERGIES:  None.   MEDICATIONS:  1.  Multiple vitamin.  2.  Aspirin 81 mg daily.  3.  Glucosamine.  4.  Zocor 20 mg a day.  5.  Lisinopril 10 mg daily (this was started at the [place], though he did not      report it to me previously).   REVIEW OF SYSTEMS:  As stated in the HPI, negative for other systems.   PHYSICAL EXAMINATION:  GENERAL:  The patient is in no distress.  VITAL SIGNS:  Blood pressure 111/63, heart rate 79 and regular.  HEENT:  Eyelids unremarkable.  Pupils are equal, round, and reactive to  light.  Fundi not visualized.  Oral mucosa unremarkable.  NECK:  No  jugular venous distention.  Wave form within normal limits.  Carotid upstroke brisk and symmetric.  No bruits.  No thyromegaly.  LYMPHATICS:  No cervical, axillary, or inguinal adenopathy.  LUNGS:  Clear to auscultation bilaterally.  BACK:  No costovertebral angle tenderness.  CHEST:  Unremarkable.  HEART:  PMI not displaced or sustained.  S1 and S2 within normal limits.  No  S3.  No S4.  2/6 apical systolic murmur heard best at the left upper sternal  border, early peaking.  No diastolic murmur.  ABDOMEN:  Flat.  Positive bowel sounds.  Normal in frequency and pitch.  No  bruits, rebound, guarding.  No midline pulsatile mass.  No organomegaly.  SKIN:  No rashes.  No nodules.  EXTREMITIES:  2+ pulses throughout.  No clubbing, cyanosis, edema.  NEUROLOGIC:  Oriented to person, place, and time.  Cranial nerves II-XII  grossly intact.  Motor grossly intact.   ASSESSMENT AND PLAN:  1.  Dyspnea.  The patient still complains of dyspnea which is new since the      spring.  Given this I am going to get a BNP level.  Given his murmur I      am going to get an echocardiogram.  If these are normal then no further      cardiovascular work-up will be planned and he will be referred back to      Dr. Amador Cunas.  2.  Coronary disease.  He will continue with aggressive secondary risk      reduction.  3.  Follow-up will be in 12 months unless there are any abnormalities on the      above tests.                               Rollene Rotunda, MD, [facility]    JH/MedQ  DD:  03/06/2006  DT:  03/06/2006  Job #:  [id number]   cc:   Gordy Savers, MD

## 2011-01-10 NOTE — Op Note (Signed)
NAME:  BLAIR, LUNDEEN                         ACCOUNT NO.:  [id number]   MEDICAL RECORD NO.:  [id number]                   PATIENT TYPE:  OIB   LOCATION:  2550                                 FACILITY:  [facility]   PHYSICIAN:  Alford Highland. Rankin, M.D.                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   OPERATION PERFORMED:  Posterior vitrectomy with membrane peel, left eye.   SURGEON:  Alford Highland. Rankin, M.D.   ANESTHESIA:  Local retrobulbar with monitored anesthesia control.   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  This is an attempt to remove the  epiretinal membrane so as to allow for enhanced visual acuity during  recovery period.  The patient understands the risks of anesthesia including  the rare occurrence of death but also to the eye including but not limited  to hemorrhage, infection, scarring, need for another surgery, no change in  vision, loss of vision and progression of disease despite intervention.  After appropriate signed consent was obtained, the patient was taken to the  operating room.   DESCRIPTION OF PROCEDURE:  Mild sedation begun.  Appropriate monitoring was  begun.  0.75% Marcaine delivered 5 mL retrobulbar followed by an additional  5 mL laterally in the fashion of modified Darel Hong.  The left periocular  region was sterilely prepped and draped in the usual ophthalmic fashion.  A  lid speculum was applied.  Conjunctival peritomy was fashioned temporally  and supranasally.  A 4 mm  infusion secured 3.5 mm posterior to the limbus in  the inferotemporal quadrant.  Placement in the vitreous cavity verified  visually.  Superior sclerotomies then fashion.  Wild microscope placed in  position, Biom attached.  Core vitrectomy was then begun.  Iatrogenic  posterior vitreous detachment was required.  Vitreous skirt was elevated and  trimmed 360 degrees to the vitreous base. DORC forceps were then used to  engage the epiretinal membrane and the epiretinal membrane was removed  followed thereafter by internal limiting membrane because of the stria that  remained.  Internal limiting membrane was removed in a continuous circular  fashion.  No complications occurred.  Instruments removed from the eye.  Peripheral retina was inspected and found to be free of retinal tears and  holes.  7-0 Vicryl was then used to close the sclerotomies.  Infusion  removed and similarly closed.  Conjunctiva closed with 7-0 Vicryl.  Subconjunctival injections of antibiotic and steroid applied.  The  patient  tolerated the procedure well without complications.  Taken to recovery room.                                               Alford Highland Rankin, M.D.    GAR/MEDQ  D:  08/03/2003  T:  08/04/2003  Job:  [id number]

## 2011-01-10 NOTE — Consult Note (Signed)
NAME:  Zachary Ball, OAK                         ACCOUNT NO.:  [id number]   MEDICAL RECORD NO.:  [id number]                   PATIENT TYPE:  OIB   LOCATION:  3709                                 FACILITY:  [facility]   PHYSICIAN:  Salvatore Decent. Dorris Fetch, M.D.         DATE OF BIRTH:  1925/08/14   DATE OF CONSULTATION:  DATE OF DISCHARGE:                       CARDIOVASCULAR/THORACIC CONSULTATION   REASON FOR CONSULTATION:  Left main coronary artery disease.   CHIEF COMPLAINT:  Exertional chest pain.   HISTORY OF PRESENT ILLNESS:  The patient is a 77 year old gentleman who has  no prior cardiac history but does report chest discomfort with exertion over  the past couple of years.  This is generally when he is walking although he  has relatively good exercise tolerance.  He has noted a decrease recently.  He can walk on level ground for about two miles, but will get short of  breath and occasionally chest pain as well with walking up a flight of  steps.  He had a fairly intense episode in June. He said it was  approximately 5 out 10 in terms of severity.  It happened after walking  about two and a half miles.  He began having some pain in his left fingers  and up his forearm and then developed burning chest discomfort.  The  discomfort lasted approximately 20 minutes.  That is the longest that it has  taken to resolve; usually it is less than 15 minutes.  He has not had any  rest or nocturnal symptoms.  He does note that it had worsened slightly  recently.   PAST MEDICAL HISTORY:  1. Degenerative joint disease.  2. Mild hyperlipidemia treated with diet, not medications.  3. No history of diabetes, hypertension, deep venous thrombosis or stroke.   PAST SURGICAL HISTORY:  1. Arthroscopy on both knees.  2. Shoulder surgery in 1975 for dislocation secondary to a fall.   MEDICATIONS:  1. Aspirin 81 mg p.o. q.d.  2. Toprol XL 50 mg p.o. q.d.  3. One multivitamin daily.  4. Calcium tablets  daily.  5. Vitamin E 400 units p.o. q.d.   FAMILY HISTORY:  Noncontributory.   SOCIAL HISTORY:  The patient is a retired Psychologist, occupational.  He is married.  He had a  90-pack year history of smoking but quit 30 years ago.   REVIEW OF SYMPTOMS:  PULMONARY:  He denies orthopnea, paroxysmal nocturnal  dyspnea or peripheral edema.  HEMATOLOGIC:  He has no history of abnormal  bleeding or clotting.  GASTROINTESTINAL:  He does have some occasional  reflux symptoms.  MUSCULOSKELETAL:  Joint pains, nighttime leg cramps.  HEENT:  Left eye trouble.  NEUROLOGIC:  He has had no stroke or TIA  symptoms.  No claudication.  All other systems are negative.   PHYSICAL EXAMINATION:  GENERAL:  The patient is a 77 year old white male in no acute distress.  He  is well developed, well nourished.  VITAL SIGNS:  Blood pressure is 155/91, heart rate 50 and regular,  respirations are 16.   NEUROLOGIC:  He alert and oriented times three and grossly intact.   HEENT:  He wears glasses, otherwise unremarkable.  His neck is supple  without thyromegaly, adenopathy or bruits.   CARDIAC EXAM:  Regular rate and rhythm with a normal S1 and S2.  No murmur,  rub or gallop.   ABDOMEN:  Soft.   EXTREMITIES:  Without cyanosis, clubbing or edema.  He has 2+ dorsalis pedis  and posterior tibial pulses bilaterally.  There are  some mild superficial varicosities and no peripheral edema.   SKIN:  Warm, pink and dry.   LABORATORY DATA:  Cardiac catheterization showed a 70% left main stenosis, a  50 to 60% tandem left anterior descending stenoses, a 50 to 60% first  diagonal, 80% in obtuse marginal one.  He has a 40 to 50% mid right coronary  artery stenosis and there is a tight lesion at the mid portion of the  posterior descending artery with a small vessel beyond that.   EKG shows sinus bradycardia with first degree AV block.   White count 5.6, hemoglobin 14.2, hematocrit 41.0, platelets 218,000.  ProTime 11.3, PTT 25.0.   Electrolytes are within normal limits, BUN and  creatinine are 11.0 and 1.0.   IMPRESSION:  The patient is a 77 year old gentleman who has a past history  of tobacco abuse and some mild hypercholesterolemia.  He presents with class  2 angina, but has had very mild progression recently.  He underwent cardiac  catheterization which revealed a 70% left main stenosis.  He also has  additional stenoses in the left anterior descending, first diagonal, OM1 and  the right coronary system. His ejection fraction is normal.   Coronary artery bypass grafting is indicated for survival benefit and relief  of symptoms.  I discussed in detail with the patient and his family the  indications, risks and benefits as well as alternative treatment.  They  understand the risks including but not limited to death, stroke, myocardial  infarction, deep venous thrombosis, pulmonary embolus, bleeding, possible  need for transfusion, infection as well as other organ dysfunction including  respiratory, renal,  hepatic or gastrointestinal difficulties.  He agrees to proceed.  He has not  had any unstable rest or nocturnal symptoms and will be scheduled for the  first available elective time slot which is Thursday for a first case.  It  could potentially done more urgently.                                               Salvatore Decent Dorris Fetch, M.D.    SCH/MEDQ  D:  03/22/2002  T:  03/24/2002  Job:  [id number]   cc:   Rollene Rotunda, M.D. [facility]   Gordy Savers, M.D. [facility]

## 2011-02-03 ENCOUNTER — Encounter: Payer: Self-pay | Admitting: Internal Medicine

## 2011-02-03 ENCOUNTER — Ambulatory Visit (INDEPENDENT_AMBULATORY_CARE_PROVIDER_SITE_OTHER): Payer: Medicare Other | Admitting: Internal Medicine

## 2011-02-03 DIAGNOSIS — I2510 Atherosclerotic heart disease of native coronary artery without angina pectoris: Secondary | ICD-10-CM

## 2011-02-03 DIAGNOSIS — J069 Acute upper respiratory infection, unspecified: Secondary | ICD-10-CM

## 2011-02-03 DIAGNOSIS — I359 Nonrheumatic aortic valve disorder, unspecified: Secondary | ICD-10-CM

## 2011-02-03 DIAGNOSIS — E785 Hyperlipidemia, unspecified: Secondary | ICD-10-CM

## 2011-02-03 NOTE — Progress Notes (Signed)
  Subjective:    Patient ID: Zachary Ball, male    DOB: July 06, 1925, 77 y.o.   MRN: [id number]  HPI 77 year old patient who is in today for followup he has a history of coronary artery disease dyslipidemia mild aortic stenosis. For the past 5 days he has had some mild chest congestion and cough cough is intermittently productive but usually a fairly dry. There's been no fever chest pain or shortness of breath. He denies any wheezing.   Review of Systems  Constitutional: Positive for fatigue. Negative for fever, chills and appetite change.  HENT: Positive for congestion. Negative for hearing loss, ear pain, sore throat, trouble swallowing, neck stiffness, dental problem, voice change and tinnitus.   Eyes: Negative for pain, discharge and visual disturbance.  Respiratory: Positive for cough. Negative for chest tightness, wheezing and stridor.   Cardiovascular: Negative for chest pain, palpitations and leg swelling.  Gastrointestinal: Negative for nausea, vomiting, abdominal pain, diarrhea, constipation, blood in stool and abdominal distention.  Genitourinary: Negative for urgency, hematuria, flank pain, discharge, difficulty urinating and genital sores.  Musculoskeletal: Negative for myalgias, back pain, joint swelling, arthralgias and gait problem.  Skin: Negative for rash.  Neurological: Negative for dizziness, syncope, speech difficulty, weakness, numbness and headaches.  Hematological: Negative for adenopathy. Does not bruise/bleed easily.  Psychiatric/Behavioral: Negative for behavioral problems and dysphoric mood. The patient is not nervous/anxious.        Objective:   Physical Exam  Constitutional: He is oriented to person, place, and time. He appears well-developed.  HENT:  Head: Normocephalic.  Right Ear: External ear normal.  Left Ear: External ear normal.  Eyes: Conjunctivae and EOM are normal.  Neck: Normal range of motion.  Cardiovascular: Normal rate and normal heart  sounds.   Pulmonary/Chest: Effort normal and breath sounds normal.       O2 saturation 95%  Breath sounds slightly diminished left base  Abdominal: Bowel sounds are normal.  Musculoskeletal: Normal range of motion. He exhibits no edema and no tenderness.  Neurological: He is alert and oriented to person, place, and time.  Psychiatric: He has a normal mood and affect. His behavior is normal.          Assessment & Plan:    Viral URI-  will treat symptomatically withRezira Coronary artery disease stable Dyslipidemia stable

## 2011-02-03 NOTE — Patient Instructions (Signed)
Get plenty of rest, Drink lots of  clear liquids, and use Tylenol or ibuprofen for fever and discomfort.    REZIRA 1/2 to one tsp every 6 hours for cough

## 2011-03-06 ENCOUNTER — Encounter: Payer: Self-pay | Admitting: Cardiology

## 2011-04-11 ENCOUNTER — Encounter: Payer: Self-pay | Admitting: Cardiology

## 2011-04-11 ENCOUNTER — Ambulatory Visit (INDEPENDENT_AMBULATORY_CARE_PROVIDER_SITE_OTHER): Payer: Medicare Other | Admitting: Cardiology

## 2011-04-11 DIAGNOSIS — I359 Nonrheumatic aortic valve disorder, unspecified: Secondary | ICD-10-CM

## 2011-04-11 DIAGNOSIS — I2510 Atherosclerotic heart disease of native coronary artery without angina pectoris: Secondary | ICD-10-CM

## 2011-04-11 DIAGNOSIS — E785 Hyperlipidemia, unspecified: Secondary | ICD-10-CM

## 2011-04-11 DIAGNOSIS — I6529 Occlusion and stenosis of unspecified carotid artery: Secondary | ICD-10-CM

## 2011-04-11 NOTE — Assessment & Plan Note (Signed)
He has mild disease and will be due for follow up next Aug.

## 2011-04-11 NOTE — Assessment & Plan Note (Signed)
This is mild and no further studies are needed.

## 2011-04-11 NOTE — Progress Notes (Signed)
HPI The patient presents for follow up of CAD.  Since I last saw him he has done well. The patient denies any new symptoms such as chest discomfort, neck or arm discomfort. There has been no new shortness of breath, PND or orthopnea. There have been no reported palpitations, presyncope or syncope.  He has some tingling in his right hand.  He has had a pneumonia since I last saw him.  He exercises at the gym 5 days per week.  No Known Allergies  Current Outpatient Prescriptions  Medication Sig Dispense Refill  . aspirin 81 MG tablet 81 mg daily. Only takes on Monday, Wed, and Friday      . Glucosamine-Chondroitin (GLUCOSAMINE CHONDR COMPLEX PO) Take by mouth daily.        . Multiple Vitamin (MULTIVITAMIN) tablet Take 1 tablet by mouth daily.        . polyethylene glycol (MIRALAX / GLYCOLAX) packet Take 17 g by mouth daily as needed.        . simvastatin (ZOCOR) 20 MG tablet Take 20 mg by mouth at bedtime. 1/2 tablet        Past Medical History  Diagnosis Date  . AORTIC STENOSIS, MILD 04/08/2007  . ARTHROSCOPY, KNEE, HX OF 01/07/2007  . BENIGN PROSTATIC HYPERTROPHY 01/07/2007  . CAROTID STENOSIS 03/19/2009  . CORONARY ARTERY DISEASE 01/07/2007    s/p CABG 02/2002  . CORONARY ATHEROSCLEROSIS NATIVE CORONARY ARTERY 03/28/2009  . GERD 01/07/2007  . HYPERLIPIDEMIA 07/02/2007  . Osteoarth NOS-Unspec 01/07/2007  . Mild aortic stenosis   . Anal fissure   . Pneumonia     Hx of   . Cataract     surgery bilateral  . Hx of colonoscopy     Past Surgical History  Procedure Date  . Coronary artery bypass graft 2003    x 4  . Shoulder surgery 1975    right  . Knee surgery     right; 1994 and 1999  . Colonoscopy     ROS:  As stated in the HPI and negative for all other systems.  PHYSICAL EXAM BP 150/68  Pulse 77  Ht 6\' 1"  (1.854 m)  Wt 224 lb (101.606 kg)  BMI 29.55 kg/m2 GENERAL:  Well appearing HEENT:  Pupils equal round and reactive, fundi not visualized, oral mucosa unremarkable NECK:   No jugular venous distention, waveform within normal limits, carotid upstroke brisk and symmetric, no bruits, no thyromegaly LYMPHATICS:  No cervical, inguinal adenopathy LUNGS:  Clear to auscultation bilaterally BACK:  No CVA tenderness CHEST:  No scans are attached to the encounter. HEART:  PMI not displaced or sustained,S1 and S2 within normal limits, no S3, no S4, no clicks, no rubs, apical systolic murmur radiating out the outflow tract. ABD:  Flat, positive bowel sounds normal in frequency in pitch, no bruits, no rebound, no guarding, no midline pulsatile mass, no hepatomegaly, no splenomegaly EXT:  2 plus pulses throughout, no edema, no cyanosis no clubbing SKIN:  No rashes no nodules NEURO:  Cranial nerves II through XII grossly intact, motor grossly intact throughout PSYCH:  Cognitively intact, oriented to person place and time  EKG:  NSR rate 78, axis wnl, no acute ST T wave changes.  ASSESSMENT AND PLAN

## 2011-04-11 NOTE — Patient Instructions (Signed)
Your physician has requested that you have an exercise tolerance test. For further information please visit [email or web address] Please also follow instruction sheet, as given.  The current medical regimen is effective;  continue present plan and medications.  Please have fasting blood work when you return for your treadmill.

## 2011-04-11 NOTE — Assessment & Plan Note (Signed)
He will have a lipid study when he comes back for his stress test.

## 2011-04-11 NOTE — Assessment & Plan Note (Signed)
I will bring the patient back for a POET (Plain Old Exercise Test). This will allow me to screen for obstructive coronary disease, risk stratify and very importantly provide a prescription for exercise.   

## 2011-04-22 ENCOUNTER — Telehealth: Payer: Self-pay | Admitting: Cardiology

## 2011-04-22 NOTE — Telephone Encounter (Signed)
Pt wife wants to know if pt need to have blood work on September since pt had blood work and sent the results to [employer]. #  [phone]

## 2011-04-22 NOTE — Telephone Encounter (Signed)
Pt aware that he needs a fasting lipid profile the same day as his GXT which is scheduled for 05/09/2011.  He was given instructions not to eat/drink after midnight.  Pt states understanding.

## 2011-04-30 ENCOUNTER — Encounter: Payer: Self-pay | Admitting: Internal Medicine

## 2011-05-09 ENCOUNTER — Ambulatory Visit (INDEPENDENT_AMBULATORY_CARE_PROVIDER_SITE_OTHER): Payer: Medicare Other | Admitting: Cardiology

## 2011-05-09 ENCOUNTER — Other Ambulatory Visit (INDEPENDENT_AMBULATORY_CARE_PROVIDER_SITE_OTHER): Payer: Medicare Other | Admitting: *Deleted

## 2011-05-09 DIAGNOSIS — R0989 Other specified symptoms and signs involving the circulatory and respiratory systems: Secondary | ICD-10-CM

## 2011-05-09 DIAGNOSIS — I2510 Atherosclerotic heart disease of native coronary artery without angina pectoris: Secondary | ICD-10-CM

## 2011-05-09 NOTE — Patient Instructions (Signed)
Your physician has requested that you have a lexiscan myoview. For further information please visit [email or web address]. Please follow instruction sheet, as given.  The current medical regimen is effective;  continue present plan and medications.  

## 2011-05-09 NOTE — Progress Notes (Signed)
Exercise Treadmill Test  Pre-Exercise Testing Evaluation Rhythm: normal sinus  Rate: 72   PR:  .20 QRS:  .10  QT:  .37 QTc: .41     Test  Exercise Tolerance Test Ordering MD: Angelina Sheriff, MD  Interpreting MD:  Angelina Sheriff, MD  Unique Test No: 1  Treadmill:  1  Indication for ETT: known ASHD  Contraindication to ETT: No   Stress Modality: exercise - treadmill  Cardiac Imaging Performed: non   Protocol: standard Bruce - maximal  Max BP:  183/94  Max MPHR (bpm):  137 85% MPR (bpm): 115  MPHR obtained (bpm):  116 % MPHR obtained:  85  Reached 85% MPHR (min:sec):  4:30 Total Exercise Time (min-sec):  5:30  Workload in METS:  7.0 Borg Scale: 16  Reason ETT Terminated:  desired heart rate attained    ST Segment Analysis At Rest: normal ST segments - no evidence of significant ST depression With Exercise: Inferior T wave inversion and peaked T waves in the anterior leads  Other Information Arrhythmia:  Yes Angina during ETT:  absent (0) Quality of ETT:  non-diagnostic  ETT Interpretation:  borderline (indeterminate) with non-specific ST changes  Comments: The patient had an moderate to poor exercise tolerance.  There was no chest pain.  There was an appropriate increased level of dyspnea.  There were rare PVCs, a normal heart rate response and accelerated BP response.  There were borderline ischemic ST T wave changes but a normal heart rate recovery.  Recommendations: Borderline stress test with EKG changes and dyspnea as described.  Also, moderate to poor tolerance.  Plan Lexiscan Myoview.

## 2011-05-21 ENCOUNTER — Ambulatory Visit (HOSPITAL_COMMUNITY): Payer: Medicare Other | Attending: Cardiology | Admitting: Radiology

## 2011-05-21 VITALS — Ht 72.0 in | Wt 219.0 lb

## 2011-05-21 DIAGNOSIS — I25810 Atherosclerosis of coronary artery bypass graft(s) without angina pectoris: Secondary | ICD-10-CM

## 2011-05-21 DIAGNOSIS — I4949 Other premature depolarization: Secondary | ICD-10-CM

## 2011-05-21 DIAGNOSIS — I2510 Atherosclerotic heart disease of native coronary artery without angina pectoris: Secondary | ICD-10-CM | POA: Insufficient documentation

## 2011-05-21 MED ORDER — TECHNETIUM TC 99M TETROFOSMIN IV KIT
33.0000 | PACK | Freq: Once | INTRAVENOUS | Status: AC | PRN
  Administered 2011-05-21: 33 via INTRAVENOUS

## 2011-05-21 MED ORDER — REGADENOSON 0.4 MG/5ML IV SOLN
0.4000 mg | Freq: Once | INTRAVENOUS | Status: AC
  Administered 2011-05-21: 0.4 mg via INTRAVENOUS

## 2011-05-21 MED ORDER — TECHNETIUM TC 99M TETROFOSMIN IV KIT
11.0000 | PACK | Freq: Once | INTRAVENOUS | Status: AC | PRN
  Administered 2011-05-21: 11 via INTRAVENOUS

## 2011-05-21 NOTE — Progress Notes (Addendum)
[facility] SITE 3 NUCLEAR MED [address] [place] [address] [phone]  Cardiology Nuclear Med Study  ONOFRE GAINS is a 77 y.o. male [id number] [birth year]   Nuclear Med Background Indication for Stress Test:  Evaluation for Ischemia, Graft Patency and Abnormal EKG History: '03 CABGx4, 03/2009 Echo: EF 55-60%, 05/09/11 GXT: Non Specific ST, T changes with poor exercise tolerence, 2003 Heart Catheterization:sent to CABG and 2007 Myocardial Perfusion Study: EF 66% (-) ischemia/scar Cardiac Risk Factors: Carotid Disease, History of Smoking, Hypertension and Lipids  Symptoms:  Palpitations   Nuclear Pre-Procedure Caffeine/Decaff Intake:  None NPO After: 8:30pm   Lungs:  clear IV 0.9% NS with Angio Cath:  20g  IV Site: R Hand  IV Started by:  Stanton Kidney, EMT-P  Chest Size (in):  44 Cup Size: n/a  Height: 6' (1.829 m)  Weight:  219 lb (99.338 kg)  BMI:  Body mass index is 29.70 kg/(m^2). Tech Comments:  NA    Nuclear Med Study 1 or 2 day study: 1 day  Stress Test Type:  Treadmill/Lexiscan  Reading MD: Kristeen Miss, MD  Order Authorizing Provider:  J.Hochrein  Resting Radionuclide: Technetium 33m Tetrofosmin  Resting Radionuclide Dose: 11.0 mCi   Stress Radionuclide:  Technetium 68m Tetrofosmin  Stress Radionuclide Dose: 33.0 mCi           Stress Protocol Rest HR: 61 Stress HR: 92  Rest BP: 140/70 Stress BP: 154/73  Exercise Time (min): n/a METS: n/a   Predicted Max HR: 136 bpm % Max HR: 67.65 bpm Rate Pressure Product: 46962   Dose of Adenosine (mg):  n/a Dose of Lexiscan: 0.4 mg  Dose of Atropine (mg): n/a Dose of Dobutamine: n/a mcg/kg/min (at max HR)  Stress Test Technologist: Milana Na, EMT-P  Nuclear Technologist:  Domenic Polite, CNMT     Rest Procedure:  Myocardial perfusion imaging was performed at rest 45 minutes following the intravenous administration of Technetium 41m Tetrofosmin. Rest ECG: SR with PVCS  Stress  Procedure:  The patient received IV Lexiscan 0.4 mg over 15-seconds with concurrent low level exercise and then Technetium 34m Tetrofosmin was injected at 30-seconds while the patient continued walking one more minute.  There were no significant changes and occ pvcs with Lexiscan.  Quantitative spect images were obtained after a 45-minute delay. Stress ECG: No significant change from baseline ECG  QPS Raw Data Images:  Normal; no motion artifact; normal heart/lung ratio. Stress Images:  Normal homogeneous uptake in all areas of the myocardium. Rest Images:  Normal homogeneous uptake in all areas of the myocardium. Subtraction (SDS):  No evidence of ischemia. Transient Ischemic Dilatation (Normal <1.22):  1.01 Lung/Heart Ratio (Normal <0.45):  0.36  Quantitative Gated Spect Images QGS EDV:  125 ml QGS ESV:  44 ml QGS cine images:  NL LV Function; NL Wall Motion QGS EF: 65%  Impression Exercise Capacity:  Lexiscan with no exercise. BP Response:  Normal blood pressure response. Clinical Symptoms:  No chest pain. ECG Impression:  No significant ST segment change suggestive of ischemia. Comparison with Prior Nuclear Study: No images to compare  Overall Impression:  Normal stress nuclear study.  No evidence of ischemia.  Normal LV function.  EF = 65%.    Vesta Mixer, Montez Hageman., MD, [facility]

## 2011-05-30 ENCOUNTER — Telehealth: Payer: Self-pay | Admitting: Cardiology

## 2011-05-30 NOTE — Telephone Encounter (Signed)
Pt wants to know test results 

## 2011-05-30 NOTE — Telephone Encounter (Signed)
Stress test  Result given patient is aware that Dr. Antoine Poche needs to review  and make recommendations if needed.

## 2011-06-04 ENCOUNTER — Telehealth: Payer: Self-pay | Admitting: Cardiology

## 2011-06-04 NOTE — Telephone Encounter (Signed)
PT'S WIFE AWARE COPY OF TEST MAILED TO PT TODAY ./CY

## 2011-06-04 NOTE — Telephone Encounter (Signed)
Please mail a copy of test results to them.  If you need to call her the [phone].

## 2011-06-04 NOTE — Telephone Encounter (Signed)
Called patient and let him lmom stress test was normal  I did not know if Dr Antoine Poche will have any recommendations but will forward to his nurse I will mail a copy today to patient  I explained on voicemail that I would mail out another copy but may not get to him before going out of town

## 2011-06-04 NOTE — Telephone Encounter (Signed)
Pt had testing done in Sept and still has not heard anything regarding results and was told we would mail results out and they still have not gotten it. They are going on vacation and are afraid something may happen if pt heart while they are gone. Can someone please call with results and still mail them out.

## 2011-06-10 ENCOUNTER — Encounter: Payer: Self-pay | Admitting: Cardiology

## 2011-07-01 ENCOUNTER — Telehealth: Payer: Self-pay | Admitting: Internal Medicine

## 2011-07-02 ENCOUNTER — Telehealth: Payer: Self-pay | Admitting: Internal Medicine

## 2011-07-02 NOTE — Telephone Encounter (Signed)
Yes, I did find in his history  That his mother had colon cancer. The colonoscopy should be recoded as V16.0 instead of V76.51. Please call Maryann [phone], she does the coding for GI. If she is not the right person, please give it to [employer]

## 2011-07-02 NOTE — Telephone Encounter (Signed)
Opened in error

## 2011-07-02 NOTE — Telephone Encounter (Signed)
Patient's mother died from colon cancer per his wife.  Medicare told her that if he was a high risk, they would pay more on his colonoscopy.  She would like [place] to code his billing as high risk.  Please advise.

## 2011-10-09 DIAGNOSIS — H35379 Puckering of macula, unspecified eye: Secondary | ICD-10-CM | POA: Diagnosis not present

## 2011-10-09 DIAGNOSIS — H353290 Exudative age-related macular degeneration, unspecified eye, stage unspecified: Secondary | ICD-10-CM | POA: Diagnosis not present

## 2011-10-09 DIAGNOSIS — H35369 Drusen (degenerative) of macula, unspecified eye: Secondary | ICD-10-CM | POA: Diagnosis not present

## 2011-10-09 DIAGNOSIS — H35039 Hypertensive retinopathy, unspecified eye: Secondary | ICD-10-CM | POA: Diagnosis not present

## 2011-10-20 DIAGNOSIS — I70209 Unspecified atherosclerosis of native arteries of extremities, unspecified extremity: Secondary | ICD-10-CM | POA: Diagnosis not present

## 2011-10-20 DIAGNOSIS — B351 Tinea unguium: Secondary | ICD-10-CM | POA: Diagnosis not present

## 2012-01-20 DIAGNOSIS — B351 Tinea unguium: Secondary | ICD-10-CM | POA: Diagnosis not present

## 2012-01-20 DIAGNOSIS — I70209 Unspecified atherosclerosis of native arteries of extremities, unspecified extremity: Secondary | ICD-10-CM | POA: Diagnosis not present

## 2012-03-30 DIAGNOSIS — B351 Tinea unguium: Secondary | ICD-10-CM | POA: Diagnosis not present

## 2012-03-30 DIAGNOSIS — I70209 Unspecified atherosclerosis of native arteries of extremities, unspecified extremity: Secondary | ICD-10-CM | POA: Diagnosis not present

## 2012-04-20 ENCOUNTER — Telehealth: Payer: Self-pay | Admitting: Cardiology

## 2012-04-20 NOTE — Telephone Encounter (Signed)
Felecha called to schedule pt.  She will call back.

## 2012-04-20 NOTE — Telephone Encounter (Signed)
Please return call to wife Alona Bene [phone]  Pt wife said she thought pt needed a carotid exam, no order in the computer.  Please return call to patient

## 2012-05-09 DIAGNOSIS — Z23 Encounter for immunization: Secondary | ICD-10-CM | POA: Diagnosis not present

## 2012-05-12 DIAGNOSIS — M1710 Unilateral primary osteoarthritis, unspecified knee: Secondary | ICD-10-CM | POA: Diagnosis not present

## 2012-05-12 DIAGNOSIS — IMO0002 Reserved for concepts with insufficient information to code with codable children: Secondary | ICD-10-CM | POA: Diagnosis not present

## 2012-06-03 DIAGNOSIS — H35379 Puckering of macula, unspecified eye: Secondary | ICD-10-CM | POA: Diagnosis not present

## 2012-06-03 DIAGNOSIS — H35359 Cystoid macular degeneration, unspecified eye: Secondary | ICD-10-CM | POA: Diagnosis not present

## 2012-06-03 DIAGNOSIS — H353190 Nonexudative age-related macular degeneration, unspecified eye, stage unspecified: Secondary | ICD-10-CM | POA: Diagnosis not present

## 2012-06-03 DIAGNOSIS — H35039 Hypertensive retinopathy, unspecified eye: Secondary | ICD-10-CM | POA: Diagnosis not present

## 2012-06-08 DIAGNOSIS — I70209 Unspecified atherosclerosis of native arteries of extremities, unspecified extremity: Secondary | ICD-10-CM | POA: Diagnosis not present

## 2012-06-08 DIAGNOSIS — B351 Tinea unguium: Secondary | ICD-10-CM | POA: Diagnosis not present

## 2012-06-14 DIAGNOSIS — M1710 Unilateral primary osteoarthritis, unspecified knee: Secondary | ICD-10-CM | POA: Diagnosis not present

## 2012-06-14 DIAGNOSIS — IMO0002 Reserved for concepts with insufficient information to code with codable children: Secondary | ICD-10-CM | POA: Diagnosis not present

## 2012-06-18 DIAGNOSIS — H02009 Unspecified entropion of unspecified eye, unspecified eyelid: Secondary | ICD-10-CM | POA: Diagnosis not present

## 2012-06-18 DIAGNOSIS — H18519 Endothelial corneal dystrophy, unspecified eye: Secondary | ICD-10-CM | POA: Diagnosis not present

## 2012-06-18 DIAGNOSIS — Z961 Presence of intraocular lens: Secondary | ICD-10-CM | POA: Diagnosis not present

## 2012-06-18 DIAGNOSIS — H52209 Unspecified astigmatism, unspecified eye: Secondary | ICD-10-CM | POA: Diagnosis not present

## 2012-06-21 DIAGNOSIS — IMO0002 Reserved for concepts with insufficient information to code with codable children: Secondary | ICD-10-CM | POA: Diagnosis not present

## 2012-06-21 DIAGNOSIS — M1710 Unilateral primary osteoarthritis, unspecified knee: Secondary | ICD-10-CM | POA: Diagnosis not present

## 2012-06-28 DIAGNOSIS — M1710 Unilateral primary osteoarthritis, unspecified knee: Secondary | ICD-10-CM | POA: Diagnosis not present

## 2012-06-28 DIAGNOSIS — IMO0002 Reserved for concepts with insufficient information to code with codable children: Secondary | ICD-10-CM | POA: Diagnosis not present

## 2012-07-05 DIAGNOSIS — M1710 Unilateral primary osteoarthritis, unspecified knee: Secondary | ICD-10-CM | POA: Diagnosis not present

## 2012-07-05 DIAGNOSIS — IMO0002 Reserved for concepts with insufficient information to code with codable children: Secondary | ICD-10-CM | POA: Diagnosis not present

## 2012-07-12 DIAGNOSIS — M1710 Unilateral primary osteoarthritis, unspecified knee: Secondary | ICD-10-CM | POA: Diagnosis not present

## 2012-07-12 DIAGNOSIS — IMO0002 Reserved for concepts with insufficient information to code with codable children: Secondary | ICD-10-CM | POA: Diagnosis not present

## 2012-07-19 DIAGNOSIS — M1710 Unilateral primary osteoarthritis, unspecified knee: Secondary | ICD-10-CM | POA: Diagnosis not present

## 2012-07-19 DIAGNOSIS — IMO0002 Reserved for concepts with insufficient information to code with codable children: Secondary | ICD-10-CM | POA: Diagnosis not present

## 2012-07-26 DIAGNOSIS — M1710 Unilateral primary osteoarthritis, unspecified knee: Secondary | ICD-10-CM | POA: Diagnosis not present

## 2012-07-26 DIAGNOSIS — IMO0002 Reserved for concepts with insufficient information to code with codable children: Secondary | ICD-10-CM | POA: Diagnosis not present

## 2012-08-02 DIAGNOSIS — M1710 Unilateral primary osteoarthritis, unspecified knee: Secondary | ICD-10-CM | POA: Diagnosis not present

## 2012-08-02 DIAGNOSIS — IMO0002 Reserved for concepts with insufficient information to code with codable children: Secondary | ICD-10-CM | POA: Diagnosis not present

## 2012-08-09 DIAGNOSIS — M1710 Unilateral primary osteoarthritis, unspecified knee: Secondary | ICD-10-CM | POA: Diagnosis not present

## 2012-08-09 DIAGNOSIS — IMO0002 Reserved for concepts with insufficient information to code with codable children: Secondary | ICD-10-CM | POA: Diagnosis not present

## 2012-08-16 DIAGNOSIS — IMO0002 Reserved for concepts with insufficient information to code with codable children: Secondary | ICD-10-CM | POA: Diagnosis not present

## 2012-08-16 DIAGNOSIS — M1710 Unilateral primary osteoarthritis, unspecified knee: Secondary | ICD-10-CM | POA: Diagnosis not present

## 2012-08-23 ENCOUNTER — Encounter: Payer: Self-pay | Admitting: Internal Medicine

## 2012-08-23 ENCOUNTER — Ambulatory Visit (INDEPENDENT_AMBULATORY_CARE_PROVIDER_SITE_OTHER): Payer: Medicare Other | Admitting: Internal Medicine

## 2012-08-23 VITALS — BP 140/76 | HR 81 | Temp 98.4°F | Resp 20 | Wt 222.0 lb

## 2012-08-23 DIAGNOSIS — J069 Acute upper respiratory infection, unspecified: Secondary | ICD-10-CM

## 2012-08-23 DIAGNOSIS — I2510 Atherosclerotic heart disease of native coronary artery without angina pectoris: Secondary | ICD-10-CM | POA: Diagnosis not present

## 2012-08-23 NOTE — Progress Notes (Signed)
Subjective:    Patient ID: Zachary Ball, male    DOB: [birth year], 78 y.o.   MRN: [id number]  HPI  78 year old patient who has a history of coronary artery disease and dyslipidemia. He presents with a three-day history of head and chest congestion and cough. No wheezing or shortness of breath. No documented fever. He generally feels unwell. Cough is his chief complaint. He has been taking no over-the-counter medicines.  Past Medical History  Diagnosis Date  . AORTIC STENOSIS, MILD 04/08/2007  . ARTHROSCOPY, KNEE, HX OF 01/07/2007  . BENIGN PROSTATIC HYPERTROPHY 01/07/2007  . CAROTID STENOSIS 03/19/2009  . CORONARY ARTERY DISEASE 01/07/2007    s/p CABG 02/2002  . CORONARY ATHEROSCLEROSIS NATIVE CORONARY ARTERY 03/28/2009  . GERD 01/07/2007  . HYPERLIPIDEMIA 07/02/2007  . Osteoarth NOS-Unspec 01/07/2007  . Mild aortic stenosis   . Anal fissure   . Pneumonia     Hx of   . Cataract     surgery bilateral  . Hx of colonoscopy     History   Social History  . Marital Status: Married    Spouse Name: N/A    Number of Children: 1  . Years of Education: N/A   Occupational History  . retired Psychologist, occupational    Social History Main Topics  . Smoking status: Former Smoker    Quit date: 08/25/1978  . Smokeless tobacco: Never Used     Comment: 90 pack year history, quit 30 years ago  . Alcohol Use: No  . Drug Use: No  . Sexually Active: Not on file   Other Topics Concern  . Not on file   Social History Narrative  . No narrative on file    Past Surgical History  Procedure Date  . Coronary artery bypass graft 2003    x 4  . Shoulder surgery 1975    right  . Knee surgery     right; 1994 and 1999  . Colonoscopy     Family History  Problem Relation Age of Onset  . Coronary artery disease Father   . Breast cancer Mother   . Stomach cancer Mother   . Colon cancer Mother   . Diabetes Brother   . Kidney disease Brother     No Known Allergies  Current Outpatient Prescriptions on File  Prior to Visit  Medication Sig Dispense Refill  . aspirin 81 MG tablet 81 mg daily. Only takes on Monday, Wed, and Friday      . Glucosamine-Chondroitin (GLUCOSAMINE CHONDR COMPLEX PO) Take by mouth daily.        . Multiple Vitamin (MULTIVITAMIN) tablet Take 1 tablet by mouth daily.        . polyethylene glycol (MIRALAX / GLYCOLAX) packet Take 17 g by mouth daily as needed.        . simvastatin (ZOCOR) 20 MG tablet Take 20 mg by mouth at bedtime. 1/2 tablet        BP 140/76  Pulse 81  Temp 98.4 F (36.9 C) (Oral)  Resp 20  Wt 222 lb (100.699 kg)  SpO2 92%       Review of Systems  Constitutional: Positive for appetite change and fatigue. Negative for fever and chills.  HENT: Positive for rhinorrhea and postnasal drip. Negative for hearing loss, ear pain, congestion, sore throat, trouble swallowing, neck stiffness, dental problem, voice change and tinnitus.   Eyes: Negative for pain, discharge and visual disturbance.  Respiratory: Positive for cough. Negative for chest tightness, wheezing and stridor.  Cardiovascular: Negative for chest pain, palpitations and leg swelling.  Gastrointestinal: Negative for nausea, vomiting, abdominal pain, diarrhea, constipation, blood in stool and abdominal distention.  Genitourinary: Negative for urgency, hematuria, flank pain, discharge, difficulty urinating and genital sores.  Musculoskeletal: Negative for myalgias, back pain, joint swelling, arthralgias and gait problem.  Skin: Negative for rash.  Neurological: Negative for dizziness, syncope, speech difficulty, weakness, numbness and headaches.  Hematological: Negative for adenopathy. Does not bruise/bleed easily.  Psychiatric/Behavioral: Negative for behavioral problems and dysphoric mood. The patient is not nervous/anxious.        Objective:   Physical Exam  Constitutional: He is oriented to person, place, and time. He appears well-developed.  HENT:  Head: Normocephalic.  Right Ear:  External ear normal.  Left Ear: External ear normal.  Eyes: Conjunctivae normal and EOM are normal.  Neck: Normal range of motion.  Cardiovascular: Normal rate and normal heart sounds.   Pulmonary/Chest: Breath sounds normal.  Abdominal: Bowel sounds are normal.  Musculoskeletal: Normal range of motion. He exhibits no edema and no tenderness.  Neurological: He is alert and oriented to person, place, and time.  Psychiatric: He has a normal mood and affect. His behavior is normal.          Assessment & Plan:   Viral URI with cough. We'll treat symptomatically Coronary artery disease stable

## 2012-08-23 NOTE — Patient Instructions (Signed)
   Acute bronchitis symptoms for less than 10 days are generally not helped by antibiotics.  Take over-the-counter expectorants and cough medications such as  Mucinex DM.  Call if there is no improvement in 5 to 7 days or if he developed worsening cough, fever, or new symptoms, such as shortness of breath or chest pain.  Take [phone] mg of Tylenol every 6 hours as needed for pain relief or fever.  Avoid taking more than 3000 mg in a 24-hour period (  This may cause liver damage).

## 2012-09-20 ENCOUNTER — Ambulatory Visit (INDEPENDENT_AMBULATORY_CARE_PROVIDER_SITE_OTHER): Payer: Medicare Other | Admitting: Family Medicine

## 2012-09-20 ENCOUNTER — Encounter: Payer: Self-pay | Admitting: Family Medicine

## 2012-09-20 VITALS — BP 148/78 | HR 82 | Temp 98.2°F | Wt 222.0 lb

## 2012-09-20 DIAGNOSIS — J069 Acute upper respiratory infection, unspecified: Secondary | ICD-10-CM | POA: Diagnosis not present

## 2012-09-20 NOTE — Progress Notes (Signed)
Chief Complaint  Patient presents with  . Cough    fatigue; no better     HPI:  Acute visit for Cough: -per [facility] seen 12/30 for 2 day hx head congestion and cough and dx with VURI -started: had a bad cold at the end of December - now mostly better, he thought he had whooping cough because coughed so bad for many weeks and felt weak. Now feeling much better over the last week but wanted to make sure he didn't have pneumonia. -denies:fever, SOB, NVD, tooth pain -has tried:musinex -sick contacts: none known -Hx of pneumonia  ROS: See pertinent positives and negatives per HPI.  Past Medical History  Diagnosis Date  . AORTIC STENOSIS, MILD 04/08/2007  . ARTHROSCOPY, KNEE, HX OF 01/07/2007  . BENIGN PROSTATIC HYPERTROPHY 01/07/2007  . CAROTID STENOSIS 03/19/2009  . CORONARY ARTERY DISEASE 01/07/2007    s/p CABG 02/2002  . CORONARY ATHEROSCLEROSIS NATIVE CORONARY ARTERY 03/28/2009  . GERD 01/07/2007  . HYPERLIPIDEMIA 07/02/2007  . Osteoarth NOS-Unspec 01/07/2007  . Mild aortic stenosis   . Anal fissure   . Pneumonia     Hx of   . Cataract     surgery bilateral  . Hx of colonoscopy     Family History  Problem Relation Age of Onset  . Coronary artery disease Father   . Breast cancer Mother   . Stomach cancer Mother   . Colon cancer Mother   . Diabetes Brother   . Kidney disease Brother     History   Social History  . Marital Status: Married    Spouse Name: N/A    Number of Children: 1  . Years of Education: N/A   Occupational History  . retired Psychologist, occupational    Social History Main Topics  . Smoking status: Former Smoker    Quit date: 08/25/1978  . Smokeless tobacco: Never Used     Comment: 90 pack year history, quit 30 years ago  . Alcohol Use: No  . Drug Use: No  . Sexually Active: None   Other Topics Concern  . None   Social History Narrative  . None    Current outpatient prescriptions:aspirin 81 MG tablet, 81 mg daily. Only takes on Monday, Wed, and Friday, Disp: ,  Rfl: ;  Glucosamine-Chondroitin (GLUCOSAMINE CHONDR COMPLEX PO), Take by mouth daily.  , Disp: , Rfl: ;  Multiple Vitamin (MULTIVITAMIN) tablet, Take 1 tablet by mouth daily.  , Disp: , Rfl: ;  polyethylene glycol (MIRALAX / GLYCOLAX) packet, Take 17 g by mouth daily as needed.  , Disp: , Rfl:  simvastatin (ZOCOR) 20 MG tablet, Take 20 mg by mouth at bedtime. 1/2 tablet, Disp: , Rfl:   EXAM:  Filed Vitals:   09/20/12 1454  BP: 148/78  Pulse: 82  Temp: 98.2 F (36.8 C)    There is no height on file to calculate BMI.  GENERAL: vitals reviewed and listed above, alert, oriented, appears well hydrated and in no acute distress  HEENT: atraumatic, conjunttiva clear, no obvious abnormalities on inspection of external nose and ears, normal appearance of ear canals and TMs, normal appearance of nasal and oropharyngeal mucosa without exudate or edema  NECK: no obvious masses on inspection  LUNGS: clear to auscultation bilaterally, no wheezes, rales or rhonchi, good air movement  CV: HRRR, no peripheral edema  MS: moves all extremities without noticeable abnormality  PSYCH: pleasant and cooperative, no obvious depression or anxiety  ASSESSMENT AND PLAN:  Discussed the following assessment  and plan:  1. Upper respiratory infection    -pt with no cough now, some resolving malaise following several weeks of what sounds like a bad cold, lungs clear today and given symptoms resolved this week advise dif symptoms return to let us know -Patient advised to return or notify a doctor immediately if symptoms worsen or persist or new concerns arise.  Patient Instructions  Follow up if cough returns     Mica Releford, [facility].

## 2012-09-20 NOTE — Patient Instructions (Signed)
Follow up if cough returns

## 2012-09-27 DIAGNOSIS — IMO0002 Reserved for concepts with insufficient information to code with codable children: Secondary | ICD-10-CM | POA: Diagnosis not present

## 2012-09-27 DIAGNOSIS — M1710 Unilateral primary osteoarthritis, unspecified knee: Secondary | ICD-10-CM | POA: Diagnosis not present

## 2012-10-13 DIAGNOSIS — B351 Tinea unguium: Secondary | ICD-10-CM | POA: Diagnosis not present

## 2012-10-13 DIAGNOSIS — I70209 Unspecified atherosclerosis of native arteries of extremities, unspecified extremity: Secondary | ICD-10-CM | POA: Diagnosis not present

## 2012-11-16 DIAGNOSIS — L821 Other seborrheic keratosis: Secondary | ICD-10-CM | POA: Diagnosis not present

## 2012-11-16 DIAGNOSIS — D485 Neoplasm of uncertain behavior of skin: Secondary | ICD-10-CM | POA: Diagnosis not present

## 2012-12-06 DIAGNOSIS — H35379 Puckering of macula, unspecified eye: Secondary | ICD-10-CM | POA: Diagnosis not present

## 2012-12-06 DIAGNOSIS — H35079 Retinal telangiectasis, unspecified eye: Secondary | ICD-10-CM | POA: Diagnosis not present

## 2012-12-06 DIAGNOSIS — H35369 Drusen (degenerative) of macula, unspecified eye: Secondary | ICD-10-CM | POA: Diagnosis not present

## 2012-12-14 DIAGNOSIS — H4720 Unspecified optic atrophy: Secondary | ICD-10-CM | POA: Diagnosis not present

## 2012-12-14 DIAGNOSIS — H35359 Cystoid macular degeneration, unspecified eye: Secondary | ICD-10-CM | POA: Diagnosis not present

## 2012-12-14 DIAGNOSIS — H35049 Retinal micro-aneurysms, unspecified, unspecified eye: Secondary | ICD-10-CM | POA: Diagnosis not present

## 2013-02-08 DIAGNOSIS — B351 Tinea unguium: Secondary | ICD-10-CM | POA: Diagnosis not present

## 2013-02-08 DIAGNOSIS — I70209 Unspecified atherosclerosis of native arteries of extremities, unspecified extremity: Secondary | ICD-10-CM | POA: Diagnosis not present

## 2013-05-08 DIAGNOSIS — Z23 Encounter for immunization: Secondary | ICD-10-CM | POA: Diagnosis not present

## 2013-05-23 DIAGNOSIS — B351 Tinea unguium: Secondary | ICD-10-CM | POA: Diagnosis not present

## 2013-05-23 DIAGNOSIS — I70209 Unspecified atherosclerosis of native arteries of extremities, unspecified extremity: Secondary | ICD-10-CM | POA: Diagnosis not present

## 2013-06-17 DIAGNOSIS — M1710 Unilateral primary osteoarthritis, unspecified knee: Secondary | ICD-10-CM | POA: Diagnosis not present

## 2013-06-17 DIAGNOSIS — IMO0002 Reserved for concepts with insufficient information to code with codable children: Secondary | ICD-10-CM | POA: Diagnosis not present

## 2013-06-21 DIAGNOSIS — H35359 Cystoid macular degeneration, unspecified eye: Secondary | ICD-10-CM | POA: Diagnosis not present

## 2013-06-21 DIAGNOSIS — H35079 Retinal telangiectasis, unspecified eye: Secondary | ICD-10-CM | POA: Diagnosis not present

## 2013-06-21 DIAGNOSIS — H35349 Macular cyst, hole, or pseudohole, unspecified eye: Secondary | ICD-10-CM | POA: Diagnosis not present

## 2013-06-21 DIAGNOSIS — H35049 Retinal micro-aneurysms, unspecified, unspecified eye: Secondary | ICD-10-CM | POA: Diagnosis not present

## 2013-06-21 DIAGNOSIS — H35379 Puckering of macula, unspecified eye: Secondary | ICD-10-CM | POA: Diagnosis not present

## 2013-06-21 LAB — HM DIABETES EYE EXAM

## 2013-06-27 ENCOUNTER — Encounter: Payer: Self-pay | Admitting: Internal Medicine

## 2013-07-20 DIAGNOSIS — M1710 Unilateral primary osteoarthritis, unspecified knee: Secondary | ICD-10-CM | POA: Diagnosis not present

## 2013-07-20 DIAGNOSIS — IMO0002 Reserved for concepts with insufficient information to code with codable children: Secondary | ICD-10-CM | POA: Diagnosis not present

## 2013-07-27 DIAGNOSIS — IMO0002 Reserved for concepts with insufficient information to code with codable children: Secondary | ICD-10-CM | POA: Diagnosis not present

## 2013-07-27 DIAGNOSIS — M1710 Unilateral primary osteoarthritis, unspecified knee: Secondary | ICD-10-CM | POA: Diagnosis not present

## 2013-08-03 DIAGNOSIS — IMO0002 Reserved for concepts with insufficient information to code with codable children: Secondary | ICD-10-CM | POA: Diagnosis not present

## 2013-08-03 DIAGNOSIS — M1710 Unilateral primary osteoarthritis, unspecified knee: Secondary | ICD-10-CM | POA: Diagnosis not present

## 2013-08-08 ENCOUNTER — Encounter: Payer: Self-pay | Admitting: Family

## 2013-08-08 ENCOUNTER — Ambulatory Visit (INDEPENDENT_AMBULATORY_CARE_PROVIDER_SITE_OTHER): Payer: Medicare Other | Admitting: Family

## 2013-08-08 VITALS — BP 134/84 | HR 100 | Temp 98.5°F | Wt 224.0 lb

## 2013-08-08 DIAGNOSIS — J209 Acute bronchitis, unspecified: Secondary | ICD-10-CM | POA: Diagnosis not present

## 2013-08-08 DIAGNOSIS — J069 Acute upper respiratory infection, unspecified: Secondary | ICD-10-CM | POA: Diagnosis not present

## 2013-08-08 MED ORDER — METHYLPREDNISOLONE 4 MG PO KIT: PACK | ORAL | Status: AC

## 2013-08-08 MED ORDER — HYDROCODONE-HOMATROPINE 5-1.5 MG/5ML PO SYRP: 5.0000 mL | ORAL_SOLUTION | Freq: Three times a day (TID) | ORAL | Status: DC | PRN

## 2013-08-08 NOTE — Progress Notes (Signed)
Subjective:    Patient ID: Zachary Ball, male    DOB: May 20, 1925, 79 y.o.   MRN: [id number]  HPI  63 year caucasian male, former smoker, presenting with nasal congestion, sneezing, wheezing, and a cough.  Symptoms began last Thursday.  He has taken Mucinex every 12 hrs since Saturday and the symptoms are still present.  Abdominal pain related to coughing more recently.    Review of Systems  Constitutional: Negative.   HENT: Positive for congestion, postnasal drip and sneezing.   Eyes: Negative.   Respiratory: Positive for cough.   Cardiovascular: Negative.   Gastrointestinal: Negative.   Endocrine: Negative.   Genitourinary: Negative.   Musculoskeletal: Negative.   Skin: Negative.   Allergic/Immunologic: Negative.   Neurological: Negative.   Hematological: Negative.   Psychiatric/Behavioral: Negative.    Past Medical History  Diagnosis Date  . AORTIC STENOSIS, MILD 04/08/2007  . ARTHROSCOPY, KNEE, HX OF 01/07/2007  . BENIGN PROSTATIC HYPERTROPHY 01/07/2007  . CAROTID STENOSIS 03/19/2009  . CORONARY ARTERY DISEASE 01/07/2007    s/p CABG 02/2002  . CORONARY ATHEROSCLEROSIS NATIVE CORONARY ARTERY 03/28/2009  . GERD 01/07/2007  . HYPERLIPIDEMIA 07/02/2007  . Osteoarth NOS-Unspec 01/07/2007  . Mild aortic stenosis   . Anal fissure   . Pneumonia     Hx of   . Cataract     surgery bilateral  . Hx of colonoscopy     History   Social History  . Marital Status: Married    Spouse Name: N/A    Number of Children: 1  . Years of Education: N/A   Occupational History  . retired Psychologist, occupational    Social History Main Topics  . Smoking status: Former Smoker    Quit date: 08/25/1978  . Smokeless tobacco: Never Used     Comment: 90 pack year history, quit 30 years ago  . Alcohol Use: No  . Drug Use: No  . Sexual Activity: Not on file   Other Topics Concern  . Not on file   Social History Narrative  . No narrative on file    Past Surgical History  Procedure Laterality Date  .  Coronary artery bypass graft  2003    x 4  . Shoulder surgery  1975    right  . Knee surgery      right; 1994 and 1999  . Colonoscopy      Family History  Problem Relation Age of Onset  . Coronary artery disease Father   . Breast cancer Mother   . Stomach cancer Mother   . Colon cancer Mother   . Diabetes Brother   . Kidney disease Brother     No Known Allergies  Current Outpatient Prescriptions on File Prior to Visit  Medication Sig Dispense Refill  . aspirin 81 MG tablet 81 mg daily. Only takes on Monday, Wed, and Friday      . Glucosamine-Chondroitin (GLUCOSAMINE CHONDR COMPLEX PO) Take by mouth daily.        . Multiple Vitamin (MULTIVITAMIN) tablet Take 1 tablet by mouth daily.        . polyethylene glycol (MIRALAX / GLYCOLAX) packet Take 17 g by mouth daily as needed.        . simvastatin (ZOCOR) 20 MG tablet Take 20 mg by mouth at bedtime. 1/2 tablet       No current facility-administered medications on file prior to visit.    BP 134/84  Pulse 100  Temp(Src) 98.5 F (36.9 C) (Oral)  Wt  224 lb (101.606 kg)chart    Objective:   Physical Exam  Constitutional: He is oriented to person, place, and time. He appears well-developed and well-nourished.  HENT:  Head: Normocephalic.  Coughing and nasal congestion noted  Cardiovascular: Normal rate, regular rhythm and normal heart sounds.   Pulmonary/Chest: Effort normal. He has wheezes.  Expiratory wheezes noted  Abdominal: Soft. Bowel sounds are normal. He exhibits no mass. There is tenderness. There is no rebound and no guarding.  Tenderness noted on palpation  Musculoskeletal: Normal range of motion.  Neurological: He is alert and oriented to person, place, and time.  Skin: Skin is warm and dry.  Psychiatric: He has a normal mood and affect.        Assessment & Plan:  Assessment 1. Bronchitis 2. Upper  Respiratory Infection  Plan Medrol Pak x 21 pills.  Hycodan cough syrup every six hours as needed.   Encourage increase in fluids and good handwashing technique.  Call office if symptoms persist or worsen.

## 2013-08-08 NOTE — Patient Instructions (Signed)

## 2013-08-10 DIAGNOSIS — M1710 Unilateral primary osteoarthritis, unspecified knee: Secondary | ICD-10-CM | POA: Diagnosis not present

## 2013-08-15 DIAGNOSIS — M1710 Unilateral primary osteoarthritis, unspecified knee: Secondary | ICD-10-CM | POA: Diagnosis not present

## 2013-09-07 ENCOUNTER — Other Ambulatory Visit: Payer: Self-pay | Admitting: Family

## 2013-09-15 ENCOUNTER — Other Ambulatory Visit: Payer: Self-pay | Admitting: Family

## 2013-09-20 ENCOUNTER — Ambulatory Visit (INDEPENDENT_AMBULATORY_CARE_PROVIDER_SITE_OTHER): Payer: Medicare Other | Admitting: Internal Medicine

## 2013-09-20 ENCOUNTER — Encounter: Payer: Self-pay | Admitting: Internal Medicine

## 2013-09-20 VITALS — BP 140/80 | HR 83 | Temp 98.1°F | Resp 20 | Ht 72.0 in | Wt 223.0 lb

## 2013-09-20 DIAGNOSIS — I2510 Atherosclerotic heart disease of native coronary artery without angina pectoris: Secondary | ICD-10-CM | POA: Diagnosis not present

## 2013-09-20 DIAGNOSIS — I359 Nonrheumatic aortic valve disorder, unspecified: Secondary | ICD-10-CM | POA: Diagnosis not present

## 2013-09-20 DIAGNOSIS — I6529 Occlusion and stenosis of unspecified carotid artery: Secondary | ICD-10-CM | POA: Diagnosis not present

## 2013-09-20 DIAGNOSIS — E785 Hyperlipidemia, unspecified: Secondary | ICD-10-CM | POA: Diagnosis not present

## 2013-09-20 LAB — COMPREHENSIVE METABOLIC PANEL
ALBUMIN: 3.8 g/dL (ref 3.5–5.2)
ALK PHOS: 67 U/L (ref 39–117)
ALT: 17 U/L (ref 0–53)
AST: 21 U/L (ref 0–37)
BUN: 16 mg/dL (ref 6–23)
CO2: 27 mEq/L (ref 19–32)
Calcium: 9 mg/dL (ref 8.4–10.5)
Chloride: 106 mEq/L (ref 96–112)
Creatinine, Ser: 1 mg/dL (ref 0.4–1.5)
GFR: 76.96 mL/min (ref >60.00)
Glucose, Bld: 103 mg/dL — ABNORMAL HIGH (ref 70–99)
POTASSIUM: 4.6 meq/L (ref 3.5–5.1)
SODIUM: 138 meq/L (ref 135–145)
TOTAL PROTEIN: 6.8 g/dL (ref 6.0–8.3)
Total Bilirubin: 0.9 mg/dL (ref 0.3–1.2)

## 2013-09-20 LAB — CBC WITH DIFFERENTIAL/PLATELET
BASOS PCT: 0.3 % (ref 0.0–3.0)
Basophils Absolute: 0 10*3/uL (ref 0.0–0.1)
EOS ABS: 0.1 10*3/uL (ref 0.0–0.7)
Eosinophils Relative: 1.5 % (ref 0.0–5.0)
HCT: 44 % (ref 39.0–52.0)
Hemoglobin: 14.6 g/dL (ref 13.0–17.0)
LYMPHS ABS: 1.1 10*3/uL (ref 0.7–4.0)
Lymphocytes Relative: 12.3 % (ref 12.0–46.0)
MCHC: 33.3 g/dL (ref 30.0–36.0)
MCV: 92.8 fl (ref 78.0–100.0)
MONO ABS: 0.6 10*3/uL (ref 0.1–1.0)
Monocytes Relative: 6.6 % (ref 3.0–12.0)
NEUTROS PCT: 79.3 % — AB (ref 43.0–77.0)
Neutro Abs: 7.3 10*3/uL (ref 1.4–7.7)
Platelets: 250 10*3/uL (ref 150.0–400.0)
RBC: 4.74 Mil/uL (ref 4.22–5.81)
RDW: 14.5 % (ref 11.5–14.6)
WBC: 9.1 10*3/uL (ref 4.5–10.5)

## 2013-09-20 LAB — LIPID PANEL
CHOL/HDL RATIO: 2
Cholesterol: 124 mg/dL (ref 0–200)
HDL: 57.2 mg/dL (ref >39.00)
LDL CALC: 48 mg/dL (ref 0–99)
Triglycerides: 93 mg/dL (ref 0.0–149.0)
VLDL: 18.6 mg/dL (ref 0.0–40.0)

## 2013-09-20 LAB — TSH: TSH: 1.17 u[IU]/mL (ref 0.35–5.50)

## 2013-09-20 MED ORDER — BENZONATATE 100 MG PO CAPS: 100.0000 mg | ORAL_CAPSULE | Freq: Two times a day (BID) | ORAL | Status: DC | PRN

## 2013-09-20 NOTE — Progress Notes (Signed)
Pre-visit discussion using our clinic review tool. No additional management support is needed unless otherwise documented below in the visit note.  

## 2013-09-20 NOTE — Progress Notes (Signed)
Subjective:    Patient ID: Zachary Ball, male    DOB: 1924-11-13, 80 y.o.   MRN: [id number]  HPI   80 year old patient who has a history of coronary artery disease mild aortic stenosis as well as carotid artery stenosis. He has not been seen by me in some time. He states that he has experienced cough and chest congestion intermittently since Thanksgiving. He was seen last month and treated in this clinic with the Medrol and guaifenesin. He noted some temporary mild improvement. He does complains of a largely nonproductive cough as well as some chest congestion. No fever no significant sputum production. Denies any chest pain or shortness of breath  Past Medical History  Diagnosis Date  . AORTIC STENOSIS, MILD 04/08/2007  . ARTHROSCOPY, KNEE, HX OF 01/07/2007  . BENIGN PROSTATIC HYPERTROPHY 01/07/2007  . CAROTID STENOSIS 03/19/2009  . CORONARY ARTERY DISEASE 01/07/2007    s/p CABG 02/2002  . CORONARY ATHEROSCLEROSIS NATIVE CORONARY ARTERY 03/28/2009  . GERD 01/07/2007  . HYPERLIPIDEMIA 07/02/2007  . Osteoarth NOS-Unspec 01/07/2007  . Mild aortic stenosis   . Anal fissure   . Pneumonia     Hx of   . Cataract     surgery bilateral  . Hx of colonoscopy     History   Social History  . Marital Status: Married    Spouse Name: N/A    Number of Children: 1  . Years of Education: N/A   Occupational History  . retired Building control surveyor    Social History Main Topics  . Smoking status: Former Smoker    Quit date: 08/25/1978  . Smokeless tobacco: Never Used     Comment: 90 pack year history, quit 30 years ago  . Alcohol Use: No  . Drug Use: No  . Sexual Activity: Not on file   Other Topics Concern  . Not on file   Social History Narrative  . No narrative on file    Past Surgical History  Procedure Laterality Date  . Coronary artery bypass graft  2003    x 4  . Shoulder surgery  1975    right  . Knee surgery      right; 1994 and 1999  . Colonoscopy      Family History  Problem  Relation Age of Onset  . Coronary artery disease Father   . Breast cancer Mother   . Stomach cancer Mother   . Colon cancer Mother   . Diabetes Brother   . Kidney disease Brother     No Known Allergies  Current Outpatient Prescriptions on File Prior to Visit  Medication Sig Dispense Refill  . aspirin 81 MG tablet 81 mg daily. Only takes on Monday, Wed, and Friday      . atorvastatin (LIPITOR) 40 MG tablet Take 40 mg by mouth daily.      . Glucosamine-Chondroitin (GLUCOSAMINE CHONDR COMPLEX PO) Take by mouth daily.        Marland Kitchen HYDROcodone-homatropine (HYCODAN) 5-1.5 MG/5ML syrup Take 5 mLs by mouth every 8 (eight) hours as needed for cough.  120 mL  0  . lisinopril (PRINIVIL,ZESTRIL) 5 MG tablet Take 5 mg by mouth daily.      . Multiple Vitamin (MULTIVITAMIN) tablet Take 1 tablet by mouth daily.        . polyethylene glycol (MIRALAX / GLYCOLAX) packet Take 17 g by mouth daily as needed.         No current facility-administered medications on file prior to visit.  BP 140/80  Pulse 83  Temp(Src) 98.1 F (36.7 C) (Oral)  Resp 20  Ht 6' (1.829 m)  Wt 223 lb (101.152 kg)  BMI 30.24 kg/m2  SpO2 96%       Review of Systems  Constitutional: Positive for fatigue. Negative for fever, chills and appetite change.  HENT: Negative for congestion, dental problem, ear pain, hearing loss, sore throat, tinnitus, trouble swallowing and voice change.   Eyes: Negative for pain, discharge and visual disturbance.  Respiratory: Positive for cough. Negative for chest tightness, wheezing and stridor.   Cardiovascular: Negative for chest pain, palpitations and leg swelling.  Gastrointestinal: Negative for nausea, vomiting, abdominal pain, diarrhea, constipation, blood in stool and abdominal distention.  Genitourinary: Negative for urgency, hematuria, flank pain, discharge, difficulty urinating and genital sores.  Musculoskeletal: Negative for arthralgias, back pain, gait problem, joint swelling,  myalgias and neck stiffness.  Skin: Negative for rash.  Neurological: Negative for dizziness, syncope, speech difficulty, weakness, numbness and headaches.  Hematological: Negative for adenopathy. Does not bruise/bleed easily.  Psychiatric/Behavioral: Negative for behavioral problems and dysphoric mood. The patient is not nervous/anxious.        Objective:   Physical Exam  Constitutional: He is oriented to person, place, and time. He appears well-developed and well-nourished. No distress.  Birdie Riddle than stated age of 45. No distress  HENT:  Head: Normocephalic.  Right Ear: External ear normal.  Left Ear: External ear normal.  Eyes: Conjunctivae and EOM are normal.  Neck: Normal range of motion.  Right carotid bruit versus transmitted murmur  Cardiovascular: Normal rate and intact distal pulses.   Murmur heard. Grade 3/6 systolic murmur  Pulmonary/Chest: Effort normal and breath sounds normal. No respiratory distress. He has no wheezes. He has no rales.  O2 saturation 96%  Abdominal: Bowel sounds are normal.  Musculoskeletal: Normal range of motion. He exhibits no edema and no tenderness.  Neurological: He is alert and oriented to person, place, and time.  Psychiatric: He has a normal mood and affect. His behavior is normal.          Assessment & Plan:   Viral URI with cough. Will treat with Mucinex DM  Aortic stenosis mild History of aortic stenosis. We'll check followup carotid artery Doppler  Schedule CPX  We'll check laboratory update today

## 2013-09-20 NOTE — Patient Instructions (Signed)
Limit your sodium (Salt) intake    It is important that you exercise regularly, at least 20 minutes 3 to 4 times per week.  If you develop chest pain or shortness of breath seek  medical attention.  Return in 6 months for follow-up   Acute bronchitis symptoms are generally not helped by antibiotics.  Take over-the-counter expectorants and cough medications such as  Mucinex DM.  Call if there is no improvement in 5 to 7 days or if he developed worsening cough, fever, or new symptoms, such as shortness of breath or chest pain.

## 2013-09-28 ENCOUNTER — Ambulatory Visit (HOSPITAL_COMMUNITY): Payer: Medicare Other | Attending: Cardiology

## 2013-09-28 ENCOUNTER — Encounter: Payer: Self-pay | Admitting: Cardiology

## 2013-09-28 DIAGNOSIS — I771 Stricture of artery: Secondary | ICD-10-CM | POA: Diagnosis not present

## 2013-09-28 DIAGNOSIS — I658 Occlusion and stenosis of other precerebral arteries: Secondary | ICD-10-CM | POA: Insufficient documentation

## 2013-09-28 DIAGNOSIS — Z951 Presence of aortocoronary bypass graft: Secondary | ICD-10-CM | POA: Insufficient documentation

## 2013-09-28 DIAGNOSIS — E785 Hyperlipidemia, unspecified: Secondary | ICD-10-CM | POA: Diagnosis not present

## 2013-09-28 DIAGNOSIS — R0989 Other specified symptoms and signs involving the circulatory and respiratory systems: Secondary | ICD-10-CM

## 2013-09-28 DIAGNOSIS — I6529 Occlusion and stenosis of unspecified carotid artery: Secondary | ICD-10-CM | POA: Insufficient documentation

## 2013-09-28 DIAGNOSIS — Z87891 Personal history of nicotine dependence: Secondary | ICD-10-CM | POA: Diagnosis not present

## 2013-09-28 DIAGNOSIS — I2510 Atherosclerotic heart disease of native coronary artery without angina pectoris: Secondary | ICD-10-CM | POA: Diagnosis not present

## 2013-10-24 ENCOUNTER — Encounter: Payer: Self-pay | Admitting: Internal Medicine

## 2013-10-25 DIAGNOSIS — B351 Tinea unguium: Secondary | ICD-10-CM | POA: Diagnosis not present

## 2013-10-25 DIAGNOSIS — I70209 Unspecified atherosclerosis of native arteries of extremities, unspecified extremity: Secondary | ICD-10-CM | POA: Diagnosis not present

## 2013-10-28 ENCOUNTER — Ambulatory Visit (INDEPENDENT_AMBULATORY_CARE_PROVIDER_SITE_OTHER): Payer: Medicare Other | Admitting: Family Medicine

## 2013-10-28 ENCOUNTER — Encounter: Payer: Self-pay | Admitting: Family Medicine

## 2013-10-28 VITALS — BP 150/80 | Temp 98.0°F | Wt 228.0 lb

## 2013-10-28 DIAGNOSIS — I2510 Atherosclerotic heart disease of native coronary artery without angina pectoris: Secondary | ICD-10-CM

## 2013-10-28 DIAGNOSIS — R0982 Postnasal drip: Secondary | ICD-10-CM

## 2013-10-28 MED ORDER — FLUTICASONE PROPIONATE 50 MCG/ACT NA SUSP: 2.0000 | Freq: Every day | NASAL | Status: DC

## 2013-10-28 NOTE — Patient Instructions (Signed)
-  started the flonase - two sprays in each nostril daily  -follow up with your doctor in 1 month

## 2013-10-28 NOTE — Progress Notes (Signed)
Chief Complaint  Patient presents with  . Fatigue    HPI:  Zachary Ball is an 80 yo M pt of Dr. Burnice Logan here for an acute visit for:  Fatigue/PND/Sneezing: -has had unfortunately a series of upper resp illnesses this winter and feels has not quite recovered from the PND, persistent sneezing and low energy since; a similar thing occured last winter -had his physical labs about 1 month ago and all looked good -occ GERD when eats too much - this only happens rarely -denies: cough, hemoptysis, weight loss, fever, SOB, CP, palpitations, depression -[facility] and reports has scheduled his yearly physical with PCP  ROS: See pertinent positives and negatives per HPI.  Past Medical History  Diagnosis Date  . AORTIC STENOSIS, MILD 04/08/2007  . ARTHROSCOPY, KNEE, HX OF 01/07/2007  . BENIGN PROSTATIC HYPERTROPHY 01/07/2007  . CAROTID STENOSIS 03/19/2009  . CORONARY ARTERY DISEASE 01/07/2007    s/p CABG 02/2002  . CORONARY ATHEROSCLEROSIS NATIVE CORONARY ARTERY 03/28/2009  . GERD 01/07/2007  . HYPERLIPIDEMIA 07/02/2007  . Osteoarth NOS-Unspec 01/07/2007  . Mild aortic stenosis   . Anal fissure   . Pneumonia     Hx of   . Cataract     surgery bilateral  . Hx of colonoscopy     Past Surgical History  Procedure Laterality Date  . Coronary artery bypass graft  2003    x 4  . Shoulder surgery  1975    right  . Knee surgery      right; 1994 and 1999  . Colonoscopy      Family History  Problem Relation Age of Onset  . Coronary artery disease Father   . Breast cancer Mother   . Stomach cancer Mother   . Colon cancer Mother   . Diabetes Brother   . Kidney disease Brother     History   Social History  . Marital Status: Married    Spouse Name: N/A    Number of Children: 1  . Years of Education: N/A   Occupational History  . retired Building control surveyor    Social History Main Topics  . Smoking status: Former Smoker    Quit date: 08/25/1978  . Smokeless tobacco: Never  Used     Comment: 90 pack year history, quit 30 years ago  . Alcohol Use: No  . Drug Use: No  . Sexual Activity: None   Other Topics Concern  . None   Social History Narrative  . None    Current outpatient prescriptions:aspirin 81 MG tablet, 81 mg daily. Only takes on Monday, Wed, and Friday, Disp: , Rfl: ;  atorvastatin (LIPITOR) 40 MG tablet, Take 40 mg by mouth daily., Disp: , Rfl: ;  benzonatate (TESSALON) 100 MG capsule, Take 1 capsule (100 mg total) by mouth 2 (two) times daily as needed for cough., Disp: 20 capsule, Rfl: 0 fluticasone (FLONASE) 50 MCG/ACT nasal spray, Place 2 sprays into both nostrils daily., Disp: 16 g, Rfl: 1;  Glucosamine-Chondroitin (GLUCOSAMINE CHONDR COMPLEX PO), Take by mouth daily.  , Disp: , Rfl: ;  GuaiFENesin (MUCINEX PO), Take 1,200 tablets by mouth 2 (two) times daily., Disp: , Rfl: ;  HYDROcodone-homatropine (HYCODAN) 5-1.5 MG/5ML syrup, Take 5 mLs by mouth every 8 (eight) hours as needed for cough., Disp: 120 mL, Rfl: 0 lisinopril (PRINIVIL,ZESTRIL) 5 MG tablet, Take 5 mg by mouth daily., Disp: , Rfl: ;  Multiple Vitamin (MULTIVITAMIN) tablet, Take 1 tablet by mouth daily.  , Disp: ,  Rfl: ;  polyethylene glycol (MIRALAX / GLYCOLAX) packet, Take 17 g by mouth daily as needed.  , Disp: , Rfl:   EXAM:  Filed Vitals:   10/28/13 1257  BP: 150/80  Temp: 98 F (36.7 C)    Body mass index is 30.92 kg/(m^2).  GENERAL: vitals reviewed and listed above, alert, oriented, appears well hydrated and in no acute distress  HEENT: atraumatic, conjunttiva clear, no obvious abnormalities on inspection of external nose and ears, normal appearance of ear canals and TMs, clear nasal congestion, mild post oropharyngeal erythema with PND and cobblestoning, no tonsillar edema or exudate, no sinus TTP  NECK: no obvious masses on inspection  LUNGS: clear to auscultation bilaterally, no wheezes, rales or rhonchi, good air movement  CV: HRRR, no peripheral edema  MS:  moves all extremities without noticeable abnormality  PSYCH: pleasant and cooperative, no obvious depression or anxiety  ASSESSMENT AND PLAN:  Discussed the following assessment and plan:  PND (post-nasal drip) - Plan: fluticasone (FLONASE) 50 MCG/ACT nasal spray  -we discussed he likely is still building strength back after recent illnesses, no alarm symptoms and suspect he has mild seasonal effective disorder given fatigue in the winter and some allergic rhinitis from hx and exam -labs were good -trial of flonase after discussion -follow up with PCP in 1 month to reassess -Patient advised to return or notify a doctor immediately if symptoms worsen or persist or new concerns arise.  Patient Instructions  -started the flonase - two sprays in each nostril daily  -follow up with your doctor in 1 month     Stephaney Steven, [place]

## 2013-10-28 NOTE — Progress Notes (Signed)
Pre visit review using our clinic review tool, if applicable. No additional management support is needed unless otherwise documented below in the visit note. 

## 2013-11-21 DIAGNOSIS — S058X9A Other injuries of unspecified eye and orbit, initial encounter: Secondary | ICD-10-CM | POA: Diagnosis not present

## 2013-12-02 ENCOUNTER — Ambulatory Visit: Payer: Medicare Other | Admitting: Internal Medicine

## 2014-03-17 ENCOUNTER — Ambulatory Visit (INDEPENDENT_AMBULATORY_CARE_PROVIDER_SITE_OTHER): Payer: Medicare Other | Admitting: Internal Medicine

## 2014-03-17 ENCOUNTER — Encounter: Payer: Self-pay | Admitting: Internal Medicine

## 2014-03-17 VITALS — BP 132/70 | HR 76 | Temp 97.9°F | Ht 72.0 in | Wt 221.0 lb

## 2014-03-17 DIAGNOSIS — I2510 Atherosclerotic heart disease of native coronary artery without angina pectoris: Secondary | ICD-10-CM | POA: Diagnosis not present

## 2014-03-17 DIAGNOSIS — I359 Nonrheumatic aortic valve disorder, unspecified: Secondary | ICD-10-CM | POA: Diagnosis not present

## 2014-03-17 DIAGNOSIS — I209 Angina pectoris, unspecified: Secondary | ICD-10-CM

## 2014-03-17 DIAGNOSIS — I25118 Atherosclerotic heart disease of native coronary artery with other forms of angina pectoris: Secondary | ICD-10-CM

## 2014-03-17 DIAGNOSIS — E785 Hyperlipidemia, unspecified: Secondary | ICD-10-CM | POA: Diagnosis not present

## 2014-03-17 NOTE — Progress Notes (Signed)
Subjective:    Patient ID: Zachary Ball, male    DOB: 05-12-1925, 80 y.o.   MRN: [id number]  HPI 80 year old patient who is seen today for his biannual followup.  He is also seen at the [facility].  He has coronary artery disease, which has been stable.  He has treated dyslipidemia, and history of mild aortic stenosis.  He has mild carotid artery stenosis.  He denies any exertional chest pain or any focal neurological symptoms.  Doing  quite well.  Past Medical History  Diagnosis Date  . AORTIC STENOSIS, MILD 04/08/2007  . ARTHROSCOPY, KNEE, HX OF 01/07/2007  . BENIGN PROSTATIC HYPERTROPHY 01/07/2007  . CAROTID STENOSIS 03/19/2009  . CORONARY ARTERY DISEASE 01/07/2007    s/p CABG 02/2002  . CORONARY ATHEROSCLEROSIS NATIVE CORONARY ARTERY 03/28/2009  . GERD 01/07/2007  . HYPERLIPIDEMIA 07/02/2007  . Osteoarth NOS-Unspec 01/07/2007  . Mild aortic stenosis   . Anal fissure   . Pneumonia     Hx of   . Cataract     surgery bilateral  . Hx of colonoscopy     History   Social History  . Marital Status: Married    Spouse Name: N/A    Number of Children: 1  . Years of Education: N/A   Occupational History  . retired Building control surveyor    Social History Main Topics  . Smoking status: Former Smoker    Quit date: 08/25/1978  . Smokeless tobacco: Never Used     Comment: 90 pack year history, quit 30 years ago  . Alcohol Use: No  . Drug Use: No  . Sexual Activity: Not on file   Other Topics Concern  . Not on file   Social History Narrative  . No narrative on file    Past Surgical History  Procedure Laterality Date  . Coronary artery bypass graft  2003    x 4  . Shoulder surgery  1975    right  . Knee surgery      right; 1994 and 1999  . Colonoscopy      Family History  Problem Relation Age of Onset  . Coronary artery disease Father   . Breast cancer Mother   . Stomach cancer Mother   . Colon cancer Mother   . Diabetes Brother   . Kidney disease Brother     No Known  Allergies  Current Outpatient Prescriptions on File Prior to Visit  Medication Sig Dispense Refill  . aspirin 81 MG tablet 81 mg daily. Only takes on Monday, Wed, and Friday      . atorvastatin (LIPITOR) 40 MG tablet Take 40 mg by mouth daily.      . Glucosamine-Chondroitin (GLUCOSAMINE CHONDR COMPLEX PO) Take by mouth daily.        . Multiple Vitamin (MULTIVITAMIN) tablet Take 1 tablet by mouth daily.        . polyethylene glycol (MIRALAX / GLYCOLAX) packet Take 17 g by mouth daily as needed.        Marland Kitchen lisinopril (PRINIVIL,ZESTRIL) 5 MG tablet Take 5 mg by mouth daily.       No current facility-administered medications on file prior to visit.    BP 132/70  Pulse 76  Temp(Src) 97.9 F (36.6 C) (Oral)  Ht 6' (1.829 m)  Wt 221 lb (100.245 kg)  BMI 29.97 kg/m2    Review of Systems  Constitutional: Negative for fever, chills, appetite change and fatigue.  HENT: Negative for congestion, dental problem, ear pain,  hearing loss, sore throat, tinnitus, trouble swallowing and voice change.   Eyes: Negative for pain, discharge and visual disturbance.  Respiratory: Negative for cough, chest tightness, wheezing and stridor.   Cardiovascular: Negative for chest pain, palpitations and leg swelling.  Gastrointestinal: Negative for nausea, vomiting, abdominal pain, diarrhea, constipation, blood in stool and abdominal distention.  Genitourinary: Negative for urgency, hematuria, flank pain, discharge, difficulty urinating and genital sores.  Musculoskeletal: Negative for arthralgias, back pain, gait problem, joint swelling, myalgias and neck stiffness.  Skin: Negative for rash.  Neurological: Negative for dizziness, syncope, speech difficulty, weakness, numbness and headaches.  Hematological: Negative for adenopathy. Does not bruise/bleed easily.  Psychiatric/Behavioral: Negative for behavioral problems and dysphoric mood. The patient is not nervous/anxious.        Objective:   Physical Exam    Constitutional: He is oriented to person, place, and time. He appears well-developed.  HENT:  Head: Normocephalic.  Right Ear: External ear normal.  Left Ear: External ear normal.  Eyes: Conjunctivae and EOM are normal.  Neck: Normal range of motion.  Cardiovascular: Normal rate.   Murmur heard.  Grade 2-3 over 6 systolic murmur, loudest at the primary aortic area Status post sternotomy  Pulmonary/Chest: He has rales.  Bibasilar crackles  Abdominal: Bowel sounds are normal.  Musculoskeletal: Normal range of motion. He exhibits no edema and no tenderness.  1 plus edema  Neurological: He is alert and oriented to person, place, and time.  Psychiatric: He has a normal mood and affect. His behavior is normal.          Assessment & Plan:   Coronary artery disease.  Remains asymptomatic Dyslipidemia.  Continue statin therapy Mild aortic stenosis.  Asymptomatic  Medicines refilled CPX 6 months Recent laboratory studies reviewed

## 2014-03-17 NOTE — Patient Instructions (Signed)
Limit your sodium (Salt) intake  Return in 6 months for follow-up  

## 2014-03-17 NOTE — Progress Notes (Signed)
Pre visit review using our clinic review tool, if applicable. No additional management support is needed unless otherwise documented below in the visit note. 

## 2014-03-20 ENCOUNTER — Ambulatory Visit: Payer: Medicare Other | Admitting: Internal Medicine

## 2014-05-10 ENCOUNTER — Ambulatory Visit (INDEPENDENT_AMBULATORY_CARE_PROVIDER_SITE_OTHER): Payer: Medicare Other | Admitting: Podiatry

## 2014-05-10 ENCOUNTER — Encounter: Payer: Self-pay | Admitting: Podiatry

## 2014-05-10 ENCOUNTER — Ambulatory Visit: Payer: Medicare Other

## 2014-05-10 ENCOUNTER — Ambulatory Visit (INDEPENDENT_AMBULATORY_CARE_PROVIDER_SITE_OTHER): Payer: Medicare Other

## 2014-05-10 VITALS — BP 140/60 | HR 78 | Resp 14 | Ht 72.0 in | Wt 215.0 lb

## 2014-05-10 DIAGNOSIS — B351 Tinea unguium: Secondary | ICD-10-CM | POA: Diagnosis not present

## 2014-05-10 DIAGNOSIS — M79609 Pain in unspecified limb: Secondary | ICD-10-CM

## 2014-05-10 DIAGNOSIS — S99929A Unspecified injury of unspecified foot, initial encounter: Secondary | ICD-10-CM | POA: Diagnosis not present

## 2014-05-10 DIAGNOSIS — S8990XA Unspecified injury of unspecified lower leg, initial encounter: Secondary | ICD-10-CM | POA: Diagnosis not present

## 2014-05-10 DIAGNOSIS — M79673 Pain in unspecified foot: Secondary | ICD-10-CM

## 2014-05-10 DIAGNOSIS — B353 Tinea pedis: Secondary | ICD-10-CM

## 2014-05-10 DIAGNOSIS — I2510 Atherosclerotic heart disease of native coronary artery without angina pectoris: Secondary | ICD-10-CM

## 2014-05-10 DIAGNOSIS — S99921A Unspecified injury of right foot, initial encounter: Secondary | ICD-10-CM

## 2014-05-10 DIAGNOSIS — S99919A Unspecified injury of unspecified ankle, initial encounter: Secondary | ICD-10-CM

## 2014-05-10 MED ORDER — CLOTRIMAZOLE-BETAMETHASONE 1-0.05 % EX CREA: 1.0000 "application " | TOPICAL_CREAM | Freq: Two times a day (BID) | CUTANEOUS | Status: AC

## 2014-05-10 NOTE — Progress Notes (Signed)
   Subjective:    Patient ID: Zachary Ball, male    DOB: 07/05/1925, 81 y.o.   MRN: [id number]  HPI Comments: Mr. Zachary Ball, 80 year old male, returns the office today for painful elongated nails as well as for evaluation of prior injury to his left foot. He states approximately 3 weeks ago he was on a treadmill which stop suddenly and he fell he twisted his foot. At that time he is having some discomfort however at the majority the pain has since resolved. He was icing the foot which helped alleviate the pain. He presented to the office today walking without difficulty. Denies any tingling or numbness to his feet. Denies any claudication symptoms. No other complaints at this time.   Foot Injury       Review of Systems  HENT: Positive for hearing loss.   All other systems reviewed and are negative.      Objective:   Physical Exam AAO x3, NAD DP/PT pulses palpable, CRT < 3sec Protective sensation intact with Simms Weinstein monofilament, vibratory sensation intact, Achilles tendon reflex intact. Nails hypertrophic, dystrophic, elongated, discolored x10. No surrounding erythema or drainage. Dry scaly skin but erythematous base. No open lesions. Mild tenderness over the left lateral aspect of the foot over the 5th metatarsal. There is no discrete pinpoint bony tenderness and no pain with vibratory sensation. No pain along the course of the peroneal tendons. No edema.  MMT 5/5, ROM WNL and without pain No leg pain/swelling/warmth.     Assessment & Plan:  80 year old male with symptomatic onychomycosis, left foot injury; tinea pedis  -X-rays were obtained and reviewed with the patient. See x-ray report for full details. No acute fracture. -Conservative versus surgical treatment were discussed including alternatives, risks, complications. -Nail sharply debrided X45 without complications. -Lotrisone cream for tinea pedis. -Left foot pain is at the results of a contusion possible tendinitis  which appears to be resolved this time. Continue with icing of the affected area. If pain continues or worsens to call the office immediately.  -Followup in 1 month or sooner any problems are to arise to recheck the foot pain; 3 months for nail debridement if needed. Call any questions or concerns or change in symptoms.

## 2014-05-10 NOTE — Patient Instructions (Addendum)
Onychomycosis/Fungal Toenails  WHAT IS IT? An infection that lies within the keratin of your nail plate that is caused by a fungus.  WHY ME? Fungal infections affect all ages, sexes, races, and creeds.  There may be many factors that predispose you to a fungal infection such as age, coexisting medical conditions such as diabetes, or an autoimmune disease; stress, medications, fatigue, genetics, etc.  Bottom line: fungus thrives in a warm, moist environment and your shoes offer such a location.  IS IT CONTAGIOUS? Theoretically, yes.  You do not want to share shoes, nail clippers or files with someone who has fungal toenails.  Walking around barefoot in the same room or sleeping in the same bed is unlikely to transfer the organism.  It is important to realize, however, that fungus can spread easily from one nail to the next on the same foot.  HOW DO WE TREAT THIS?  There are several ways to treat this condition.  Treatment may depend on many factors such as age, medications, pregnancy, liver and kidney conditions, etc.  It is best to ask your doctor which options are available to you.  1. No treatment.   Unlike many other medical concerns, you can live with this condition.  However for many people this can be a painful condition and may lead to ingrown toenails or a bacterial infection.  It is recommended that you keep the nails cut short to help reduce the amount of fungal nail. 2. Topical treatment.  These range from herbal remedies to prescription strength nail lacquers.  About 40-50% effective, topicals require twice daily application for approximately 9 to 12 months or until an entirely new nail has grown out.  The most effective topicals are medical grade medications available through physicians offices. 3. Oral antifungal medications.  With an 80-90% cure rate, the most common oral medication requires 3 to 4 months of therapy and stays in your system for a year as the new nail grows out.  Oral  antifungal medications do require blood work to make sure it is a safe drug for you.  A liver function panel will be performed prior to starting the medication and after the first month of treatment.  It is important to have the blood work performed to avoid any harmful side effects.  In general, this medication safe but blood work is required. 4. Laser Therapy.  This treatment is performed by applying a specialized laser to the affected nail plate.  This therapy is noninvasive, fast, and non-painful.  It is not covered by insurance and is therefore, out of pocket.  The results have been very good with a 80-95% cure rate.  [facility] is the only practice in the area to offer this therapy. Permanent Nail Avulsion.  Removing the entire nail so that a new nail will not grow back.Athlete's Foot Athlete's foot (tinea pedis) is a fungal infection of the skin on the feet. It often occurs on the skin between the toes or underneath the toes. It can also occur on the soles of the feet. Athlete's foot is more likely to occur in hot, humid weather. Not washing your feet or changing your socks often enough can contribute to athlete's foot. The infection can spread from person to person (contagious). CAUSES Athlete's foot is caused by a fungus. This fungus thrives in warm, moist places. Most people get athlete's foot by sharing shower stalls, towels, and wet floors with an infected person. People with weakened immune systems, including  those with diabetes, may be more likely to get athlete's foot. SYMPTOMS   Itchy areas between the toes or on the soles of the feet.  White, flaky, or scaly areas between the toes or on the soles of the feet.  Tiny, intensely itchy blisters between the toes or on the soles of the feet.  Tiny cuts on the skin. These cuts can develop a bacterial infection.  Thick or discolored toenails. DIAGNOSIS  Your caregiver can usually tell what the problem is by doing a physical  exam. Your caregiver may also take a skin sample from the rash area. The skin sample may be examined under a microscope, or it may be tested to see if fungus will grow in the sample. A sample may also be taken from your toenail for testing. TREATMENT  Over-the-counter and prescription medicines can be used to kill the fungus. These medicines are available as powders or creams. Your caregiver can suggest medicines for you. Fungal infections respond slowly to treatment. You may need to continue using your medicine for several weeks. PREVENTION   Do not share towels.  Wear sandals in wet areas, such as shared locker rooms and shared showers.  Keep your feet dry. Wear shoes that allow air to circulate. Wear cotton or wool socks. HOME CARE INSTRUCTIONS   Take medicines as directed by your caregiver. Do not use steroid creams on athlete's foot.  Keep your feet clean and cool. Wash your feet daily and dry them thoroughly, especially between your toes.  Change your socks every day. Wear cotton or wool socks. In hot climates, you may need to change your socks 2 to 3 times per day.  Wear sandals or canvas tennis shoes with good air circulation.  If you have blisters, soak your feet in Burow's solution or Epsom salts for 20 to 30 minutes, 2 times a day to dry out the blisters. Make sure you dry your feet thoroughly afterward. SEEK MEDICAL CARE IF:   You have a fever.  You have swelling, soreness, warmth, or redness in your foot.  You are not getting better after 7 days of treatment.  You are not completely cured after 30 days.  You have any problems caused by your medicines. MAKE SURE YOU:   Understand these instructions.  Will watch your condition.  Will get help right away if you are not doing well or get worse. Document Released: 08/08/2000 Document Revised: 11/03/2011 Document Reviewed: 05/30/2011 [facility] Patient Information 2015 [place], [place]. This information is not intended to  replace advice given to you by your health care provider. Make sure you discuss any questions you have with your health care provider.   If your left foot continues to be painful, or symptoms worsen please call the office to come in sooner than your 3 month check. Please call with any questions/concerns.

## 2014-05-11 ENCOUNTER — Encounter: Payer: Self-pay | Admitting: Podiatry

## 2014-05-20 DIAGNOSIS — Z23 Encounter for immunization: Secondary | ICD-10-CM | POA: Diagnosis not present

## 2014-06-02 ENCOUNTER — Ambulatory Visit (INDEPENDENT_AMBULATORY_CARE_PROVIDER_SITE_OTHER): Payer: Medicare Other | Admitting: Podiatry

## 2014-06-02 ENCOUNTER — Encounter: Payer: Self-pay | Admitting: Podiatry

## 2014-06-02 VITALS — BP 129/72 | HR 70 | Resp 15

## 2014-06-02 DIAGNOSIS — B353 Tinea pedis: Secondary | ICD-10-CM | POA: Diagnosis not present

## 2014-06-02 DIAGNOSIS — I2510 Atherosclerotic heart disease of native coronary artery without angina pectoris: Secondary | ICD-10-CM

## 2014-06-02 DIAGNOSIS — M79673 Pain in unspecified foot: Secondary | ICD-10-CM | POA: Diagnosis not present

## 2014-06-04 ENCOUNTER — Encounter: Payer: Self-pay | Admitting: Podiatry

## 2014-06-04 NOTE — Progress Notes (Signed)
Patient ID: Zachary Ball, male   DOB: 1925-05-01, 80 y.o.   MRN: [id number]  Subjective: Zachary Ball returns the office they for followup evaluation of left foot pain/injury as well as tinea pedis. He states that he no longer has pain into his left foot. He has been exercising which consists of walking on a treadmill multiple times a week since his last appointment and he has been doing this without difficulty. Denies any swelling, pain, redness to the area. Also states that since he started using the Lotrisone cream for tinea pedis he noticed a difference about 2 days after starting to use it. Denies any itching to his feet. No other complaints at this time. Denies any acute changes since last appointment. No other complaints at this time.  Objective: AAO x3, NAD DP/PT pulses palpable bilaterally, CRT less than 3 seconds. Protective sensation intact with Zachary Ball monofilament, Achilles tendon reflex intact, vibratory sensation intact There continues to be some dried, scaly, pealing skin on the plantar aspect of the feet although this is decreased since last appointment. No open lesions or pre-ulcerative lesions. No tenderness over the left foot over the lateral aspect. There is no pain on palpation to the fifth metatarsal base, cuboid, foot, ankle. No pain with vibratory sensation. No pain along the course of the peroneal tendons. No overlying edema, erythema, increased warmth. MMT 5/5, ROM WNL No calf pain, swelling, warmth.  Assessment: -Treatment options discussed including alternatives, risks, complications. -At this time the patient's left foot pain has resolved. Continue to monitor her symptoms. -Continue with Lotrisone cream for tinea pedis. If symptoms persist we'll consider switching medication. -Followup in 2 months for routine exam or sooner if any problems are to arise or any change in symptoms. In the meantime call the office with any questions, concerns.

## 2014-06-20 DIAGNOSIS — H35363 Drusen (degenerative) of macula, bilateral: Secondary | ICD-10-CM | POA: Diagnosis not present

## 2014-06-20 DIAGNOSIS — H3531 Nonexudative age-related macular degeneration: Secondary | ICD-10-CM | POA: Diagnosis not present

## 2014-06-20 DIAGNOSIS — H35373 Puckering of macula, bilateral: Secondary | ICD-10-CM | POA: Diagnosis not present

## 2014-06-20 DIAGNOSIS — H4720 Unspecified optic atrophy: Secondary | ICD-10-CM | POA: Diagnosis not present

## 2014-06-22 DIAGNOSIS — M170 Bilateral primary osteoarthritis of knee: Secondary | ICD-10-CM | POA: Diagnosis not present

## 2014-06-29 DIAGNOSIS — M170 Bilateral primary osteoarthritis of knee: Secondary | ICD-10-CM | POA: Diagnosis not present

## 2014-07-06 DIAGNOSIS — M170 Bilateral primary osteoarthritis of knee: Secondary | ICD-10-CM | POA: Diagnosis not present

## 2014-07-13 DIAGNOSIS — M170 Bilateral primary osteoarthritis of knee: Secondary | ICD-10-CM | POA: Diagnosis not present

## 2014-07-19 DIAGNOSIS — M170 Bilateral primary osteoarthritis of knee: Secondary | ICD-10-CM | POA: Diagnosis not present

## 2014-08-04 ENCOUNTER — Ambulatory Visit (INDEPENDENT_AMBULATORY_CARE_PROVIDER_SITE_OTHER): Payer: Medicare Other | Admitting: Podiatry

## 2014-08-04 DIAGNOSIS — M79676 Pain in unspecified toe(s): Secondary | ICD-10-CM

## 2014-08-04 DIAGNOSIS — B351 Tinea unguium: Secondary | ICD-10-CM

## 2014-08-04 NOTE — Patient Instructions (Signed)
Diabetes and Foot Care Diabetes may cause you to have problems because of poor blood supply (circulation) to your feet and legs. This may cause the skin on your feet to become thinner, break easier, and heal more slowly. Your skin may become dry, and the skin may peel and crack. You may also have nerve damage in your legs and feet causing decreased feeling in them. You may not notice minor injuries to your feet that could lead to infections or more serious problems. Taking care of your feet is one of the most important things you can do for yourself.  HOME CARE INSTRUCTIONS  Wear shoes at all times, even in the house. Do not go barefoot. Bare feet are easily injured.  Check your feet daily for blisters, cuts, and redness. If you cannot see the bottom of your feet, use a mirror or ask someone for help.  Wash your feet with warm water (do not use hot water) and mild soap. Then pat your feet and the areas between your toes until they are completely dry. Do not soak your feet as this can dry your skin.  Apply a moisturizing lotion or petroleum jelly (that does not contain alcohol and is unscented) to the skin on your feet and to dry, brittle toenails. Do not apply lotion between your toes.  Trim your toenails straight across. Do not dig under them or around the cuticle. File the edges of your nails with an emery board or nail file.  Do not cut corns or calluses or try to remove them with medicine.  Wear clean socks or stockings every day. Make sure they are not too tight. Do not wear knee-high stockings since they may decrease blood flow to your legs.  Wear shoes that fit properly and have enough cushioning. To break in new shoes, wear them for just a few hours a day. This prevents you from injuring your feet. Always look in your shoes before you put them on to be sure there are no objects inside.  Do not cross your legs. This may decrease the blood flow to your feet.  If you find a minor scrape,  cut, or break in the skin on your feet, keep it and the skin around it clean and dry. These areas may be cleansed with mild soap and water. Do not cleanse the area with peroxide, alcohol, or iodine.  When you remove an adhesive bandage, be sure not to damage the skin around it.  If you have a wound, look at it several times a day to make sure it is healing.  Do not use heating pads or hot water bottles. They may burn your skin. If you have lost feeling in your feet or legs, you may not know it is happening until it is too late.  Make sure your health care provider performs a complete foot exam at least annually or more often if you have foot problems. Report any cuts, sores, or bruises to your health care provider immediately. SEEK MEDICAL CARE IF:   You have an injury that is not healing.  You have cuts or breaks in the skin.  You have an ingrown nail.  You notice redness on your legs or feet.  You feel burning or tingling in your legs or feet.  You have pain or cramps in your legs and feet.  Your legs or feet are numb.  Your feet always feel cold. SEEK IMMEDIATE MEDICAL CARE IF:   There is increasing redness,   swelling, or pain in or around a wound.  There is a red line that goes up your leg.  Pus is coming from a wound.  You develop a fever or as directed by your health care provider.  You notice a bad smell coming from an ulcer or wound. Document Released: 08/08/2000 Document Revised: 04/13/2013 Document Reviewed: 01/18/2013 [employer] Patient Information 2015 [employer]. This information is not intended to replace advice given to you by your health care provider. Make sure you discuss any questions you have with your health care provider.  

## 2014-08-07 NOTE — Progress Notes (Signed)
Patient ID: Zachary Ball, male   DOB: Dec 13, 1924, 80 y.o.   MRN: [id number]  Subjective: 80 year old male returns the office today with painful elongated toenails. States that he has pain particularly with wearing shoes and pressure to the nails. He states that he is been packed exercises without any discomfort he has no other pain to his feet. No acute changes since last appointment and no other complaints at this time. Denies any systemic complaints as fevers, chills, nausea, vomiting.  Objective: AAO x3, NAD DP/PT pulses palpable bilaterally, CRT less than 3 seconds Protective sensation intact with Simms Weinstein monofilament, vibratory sensation intact, Achilles tendon reflex intact Nails hypertrophic, dystrophic, elongated, brittle, discolored 10. No surrounding erythema or drainage from around the nail sites. There is mild peeling, dry, scaly skin although is improved. No open lesions or pre-ulcerative lesions. No pain with calf compression, swelling, warmth, erythema.  Assessment: 80 year old male with symptomatic onychomycosis  Plan: -Treatment options were discussed including alternatives, risks, complications. -Nail sharply debrided 10 without complications -Tinea pedis improved -Discussed the importance of daily foot inspection -Follow-up in 3 months or sooner if any problems should arise. In the meantime, call the office with any questions, concerns, change in symptoms.

## 2014-08-09 ENCOUNTER — Ambulatory Visit: Payer: Medicare Other | Admitting: Podiatry

## 2014-09-19 ENCOUNTER — Encounter: Payer: Self-pay | Admitting: Internal Medicine

## 2014-09-19 ENCOUNTER — Ambulatory Visit (INDEPENDENT_AMBULATORY_CARE_PROVIDER_SITE_OTHER): Payer: Medicare Other | Admitting: Internal Medicine

## 2014-09-19 DIAGNOSIS — I2510 Atherosclerotic heart disease of native coronary artery without angina pectoris: Secondary | ICD-10-CM | POA: Diagnosis not present

## 2014-09-19 DIAGNOSIS — M150 Primary generalized (osteo)arthritis: Secondary | ICD-10-CM | POA: Diagnosis not present

## 2014-09-19 DIAGNOSIS — M8949 Other hypertrophic osteoarthropathy, multiple sites: Secondary | ICD-10-CM

## 2014-09-19 DIAGNOSIS — Z23 Encounter for immunization: Secondary | ICD-10-CM

## 2014-09-19 DIAGNOSIS — Z0000 Encounter for general adult medical examination without abnormal findings: Secondary | ICD-10-CM | POA: Diagnosis not present

## 2014-09-19 DIAGNOSIS — K219 Gastro-esophageal reflux disease without esophagitis: Secondary | ICD-10-CM | POA: Diagnosis not present

## 2014-09-19 DIAGNOSIS — E785 Hyperlipidemia, unspecified: Secondary | ICD-10-CM

## 2014-09-19 DIAGNOSIS — M159 Polyosteoarthritis, unspecified: Secondary | ICD-10-CM

## 2014-09-19 LAB — COMPREHENSIVE METABOLIC PANEL
ALT: 16 U/L (ref 0–53)
AST: 23 U/L (ref 0–37)
Albumin: 4 g/dL (ref 3.5–5.2)
Alkaline Phosphatase: 79 U/L (ref 39–117)
BUN: 21 mg/dL (ref 6–23)
CALCIUM: 9.4 mg/dL (ref 8.4–10.5)
CO2: 25 mEq/L (ref 19–32)
Chloride: 108 mEq/L (ref 96–112)
Creatinine, Ser: 0.93 mg/dL (ref 0.40–1.50)
GFR: 81.57 mL/min (ref >60.00)
Glucose, Bld: 93 mg/dL (ref 70–99)
Potassium: 4.2 mEq/L (ref 3.5–5.1)
SODIUM: 141 meq/L (ref 135–145)
Total Bilirubin: 0.8 mg/dL (ref 0.2–1.2)
Total Protein: 6.8 g/dL (ref 6.0–8.3)

## 2014-09-19 LAB — CBC WITH DIFFERENTIAL/PLATELET
BASOS PCT: 0.5 % (ref 0.0–3.0)
Basophils Absolute: 0 10*3/uL (ref 0.0–0.1)
EOS ABS: 0.2 10*3/uL (ref 0.0–0.7)
Eosinophils Relative: 2.2 % (ref 0.0–5.0)
HCT: 43 % (ref 39.0–52.0)
Hemoglobin: 14.5 g/dL (ref 13.0–17.0)
LYMPHS ABS: 1.6 10*3/uL (ref 0.7–4.0)
Lymphocytes Relative: 19.1 % (ref 12.0–46.0)
MCHC: 33.7 g/dL (ref 30.0–36.0)
MCV: 90.8 fl (ref 78.0–100.0)
Monocytes Absolute: 0.6 10*3/uL (ref 0.1–1.0)
Monocytes Relative: 7 % (ref 3.0–12.0)
NEUTROS ABS: 5.9 10*3/uL (ref 1.4–7.7)
Neutrophils Relative %: 71.2 % (ref 43.0–77.0)
PLATELETS: 199 10*3/uL (ref 150.0–400.0)
RBC: 4.73 Mil/uL (ref 4.22–5.81)
RDW: 14.7 % (ref 11.5–15.5)
WBC: 8.3 10*3/uL (ref 4.0–10.5)

## 2014-09-19 LAB — LIPID PANEL
CHOL/HDL RATIO: 2
Cholesterol: 113 mg/dL (ref 0–200)
HDL: 52 mg/dL (ref >39.00)
LDL Cholesterol: 44 mg/dL (ref 0–99)
NonHDL: 61
Triglycerides: 83 mg/dL (ref 0.0–149.0)
VLDL: 16.6 mg/dL (ref 0.0–40.0)

## 2014-09-19 LAB — TSH: TSH: 7.73 u[IU]/mL — AB (ref 0.35–4.50)

## 2014-09-19 NOTE — Progress Notes (Signed)
Pre visit review using our clinic review tool, if applicable. No additional management support is needed unless otherwise documented below in the visit note. 

## 2014-09-19 NOTE — Progress Notes (Signed)
Subjective:    Patient ID: Zachary Ball, male    DOB: Jul 07, 1925, 81 y.o.   MRN: [id number]  HPI   81 year old patient seen today for a wellness exam.  He has a history of coronary artery disease and mild aortic stenosis.  He is followed by cardiology.  Doing quite well today without concerns or complaints.  He has decreased visual acuity involving the left eye and has been followed by Dr. Belva Crome  Smoking Status: quit  Allergies (verified): No Known Drug Allergies  Past History:  Past Medical History: Coronary artery disease status post CABG July 2003 GERD Benign prostatic hypertrophy Hyperlipidemia Osteoarthritis mild aortic stenosis diminished auditory acuity  Past Surgical History:  Reviewed history from 01/07/2007 and no changes required:  Coronary artery bypass graft (2003)   Family History:  Reviewed history and no changes required:  Father coronary artery disease. Mother died at 23 of breast cancer  6 brothers, positive for diabetes, chronic kidney disease  3 sisters  Social History:  Reviewed history and no changes required:  Married  Past Medical History  Diagnosis Date  . AORTIC STENOSIS, MILD 04/08/2007  . ARTHROSCOPY, KNEE, HX OF 01/07/2007  . BENIGN PROSTATIC HYPERTROPHY 01/07/2007  . CAROTID STENOSIS 03/19/2009  . CORONARY ARTERY DISEASE 01/07/2007    s/p CABG 02/2002  . CORONARY ATHEROSCLEROSIS NATIVE CORONARY ARTERY 03/28/2009  . GERD 01/07/2007  . HYPERLIPIDEMIA 07/02/2007  . Osteoarth NOS-Unspec 01/07/2007  . Mild aortic stenosis   . Anal fissure   . Pneumonia     Hx of   . Cataract     surgery bilateral  . Hx of colonoscopy     History   Social History  . Marital Status: Married    Spouse Name: N/A    Number of Children: 1  . Years of Education: N/A   Occupational History  . retired Building control surveyor    Social History Main Topics  . Smoking status: Former Smoker    Quit date:  08/25/1978  . Smokeless tobacco: Never Used     Comment: 90 pack year history, quit 30 years ago  . Alcohol Use: No  . Drug Use: No  . Sexual Activity: Not on file   Other Topics Concern  . Not on file   Social History Narrative    Past Surgical History  Procedure Laterality Date  . Coronary artery bypass graft  2003    x 4  . Shoulder surgery  1975    right  . Knee surgery      right; 1994 and 1999  . Colonoscopy      Family History  Problem Relation Age of Onset  . Coronary artery disease Father   . Breast cancer Mother   . Stomach cancer Mother   . Colon cancer Mother   . Diabetes Brother   . Kidney disease Brother     No Known Allergies  Current Outpatient Prescriptions on File Prior to Visit  Medication Sig Dispense Refill  . albuterol (PROVENTIL HFA;VENTOLIN HFA) 108 (90 BASE) MCG/ACT inhaler Inhale into the lungs every 6 (six) hours as needed for wheezing or shortness of breath.    Marland Kitchen aspirin 81 MG tablet 81 mg daily. Only takes on Monday, Wed, and Friday    . atorvastatin (LIPITOR) 40 MG tablet Take 40 mg by mouth daily.    . clotrimazole-betamethasone (LOTRISONE) cream Apply 1 application topically 2 (two) times daily. 30 g 0  . Glucosamine-Chondroitin (GLUCOSAMINE CHONDR COMPLEX PO)  Take by mouth daily.      . Multiple Vitamin (MULTIVITAMIN) tablet Take 1 tablet by mouth daily.      . polyethylene glycol (MIRALAX / GLYCOLAX) packet Take 17 g by mouth daily as needed.       No current facility-administered medications on file prior to visit.    BP 140/82 mmHg  Pulse 68  Temp(Src) 97.9 F (36.6 C) (Oral)  Resp 20  Ht 5\' 10"  (1.778 m)  Wt 224 lb (101.606 kg)  BMI 32.14 kg/m2  SpO2 96%    1. Risk factors, based on past  M,S,F history.  Patient has known CAD status post CABG  2.  Physical activities: Limited due to age and mild arthritis  3.  Depression/mood: No history depression or mood disorder  4.  Hearing: Uses bilateral hearing aids  5.   ADL's: Independent in all aspects of daily living  6.  Fall risk: Mildly increased due to age  62.  Home safety: No problems identified  8.  Height weight, and visual acuity; height and weight stable.  Decreased visual acuity, left eye  9.  Counseling: Heart healthy diet.  Encouraged  10. Lab orders based on risk factors: Laboratory update will be reviewed  11. Referral : Follow-up cardiology  12. Care plan: Low-salt, heart healthy diet.  Encouraged  13. Cognitive assessment: Alert in order with normal affect.  No cognitive dysfunction  14. Screening: We'll continue annual health examinations with screening lab.  Patient was provided with a written and personalized care plan  15. Provider List Update: Includes cardiology, ophthalmology and primary care    Review of Systems  Constitutional: Negative for fever, chills, appetite change and fatigue.  HENT: Positive for hearing loss. Negative for congestion, dental problem, ear pain, sore throat, tinnitus, trouble swallowing and voice change.   Eyes: Positive for visual disturbance. Negative for pain and discharge.  Respiratory: Negative for cough, chest tightness, wheezing and stridor.   Cardiovascular: Negative for chest pain, palpitations and leg swelling.  Gastrointestinal: Negative for nausea, vomiting, abdominal pain, diarrhea, constipation, blood in stool and abdominal distention.  Genitourinary: Negative for urgency, hematuria, flank pain, discharge, difficulty urinating and genital sores.  Musculoskeletal: Positive for arthralgias. Negative for myalgias, back pain, joint swelling, gait problem and neck stiffness.  Skin: Negative for rash.  Neurological: Negative for dizziness, syncope, speech difficulty, weakness, numbness and headaches.  Hematological: Negative for adenopathy. Does not bruise/bleed easily.  Psychiatric/Behavioral: Negative for behavioral problems and dysphoric mood. The patient is not nervous/anxious.         Objective:   Physical Exam  Constitutional: He appears well-developed and well-nourished.  HENT:  Head: Normocephalic and atraumatic.  Right Ear: External ear normal.  Left Ear: External ear normal.  Nose: Nose normal.  Mouth/Throat: Oropharynx is clear and moist.  Eyes: Conjunctivae and EOM are normal. Pupils are equal, round, and reactive to light. No scleral icterus.  Neck: Normal range of motion. Neck supple. No JVD present. No thyromegaly present.  Transmitted murmur to the carotid and supraclavicular distributions  Cardiovascular: Regular rhythm and intact distal pulses.  Exam reveals no gallop and no friction rub.   Murmur heard. Grade 3/6 systolic murmur  Sternotomy scar  Pedal pulses intact  Pulmonary/Chest: Effort normal. He has rales. He exhibits no tenderness.  Rales right base  Abdominal: Soft. Bowel sounds are normal. He exhibits no distension and no mass. There is no tenderness.  Genitourinary: Prostate normal and penis normal.  Musculoskeletal: Normal range  of motion. He exhibits edema. He exhibits no tenderness.  Lymphadenopathy:    He has no cervical adenopathy.  Neurological: He is alert. He has normal reflexes. No cranial nerve deficit. Coordination normal.  Skin: Skin is warm and dry. No rash noted.  Stasis dermatitis involving the lower extremities  Psychiatric: He has a normal mood and affect. His behavior is normal.          Assessment & Plan:  Preventive health examination Coronary artery disease.  Will continue efforts at risk factor modification.  Continue daily aspirin and statin therapy Dyslipidemia  Recheck 6 months Laboratory studies reviewed

## 2014-09-19 NOTE — Patient Instructions (Signed)
Limit your sodium (Salt) intake  Return in 6 months for follow-up  Cardiac Diet A cardiac diet can help stop heart disease or a stroke from happening. It involves eating less unhealthy fats and eating more healthy fats.  FOODS TO AVOID OR LIMIT  Limit saturated fats. This type of fat is found in oils and dairy products, such as:  Coconut oil.  Palm oil.  Cocoa butter.  Butter.  Avoid trans-fat or hydrogenated oils. These are found in fried or pre-made baked goods, such as:  Margarine.  Pre-made cookies, cakes, and crackers.  Limit processed meats (hot dogs, deli meats, sausage) to 3 ounces a week.  Limit high-fat meats (marbled meats, fried chicken, or chicken with skin) to 3 ounces a week.  Limit salt (sodium) to 1500 milligrams a day.   Limit sweets and drinks with added sugar to no more than 5 servings a week. One serving is:  1 tablespoon of sugar.  1 tablespoon of jelly or jam.   cup sorbet.  1 cup lemonade.   cup regular soda. EAT MORE OF THE FOLLOWING FOODS Fruit  Eat 4to 5 servings a day. One serving of fruit is:  1 medium whole fruit.   cup dried fruit.   cup of fresh, frozen, or canned fruit.   cup 100% fruit juice. Vegetables  Eat 4 to 5 servings a day. One serving is:  1 cup raw leafy vegetables.   cup raw or cooked, cut-up vegetables.   cup vegetable juice. Whole Grains  Eat 3 servings a day (1 ounce equals 1 serving). Legumes (such as beans, peas, and lentils)   Eat at least 4 servings a week ( cup equals 1 serving). Nuts and Seeds   Eat at least 4 servings a week ( cup equals 1 serving). Dietary Fiber  Eat 20 to 30 grams a day. Some foods high in dietary fiber include:  Dried beans.  Citrus fruits.  Apples, bananas.  Broccoli, Brussels sprouts, and eggplant.  Oats. Omega-3 Fats  Eat food with omega-3 fats. You can also take a dietary pill (supplement) that has 1 gram of DHA and EPA. Have 3.5 ounces of fatty  fish a week, such as:  Salmon.  Mackerel.  [place]ac[place] tuna.  Sardines.  Lake trout.  Herring. PREPARING YOUR FOOD  Broil, bake, steam, or roast foods. Do not fry food. Do not cook food in butter (fat).  Use non-stick cooking sprays.  Remove skin from poultry, such as chicken and [place].  Remove fat from meat.  Take the fat off the top of stews, soups, and gravy.  Use lemon or herbs to flavor food instead of using butter or margarine.  Use nonfat yogurt, salsa, or low-fat dressings for salads. Document Released: 02/10/2012 Document Reviewed: 02/10/2012 [facility] Patient Information 2015 [place]. This information is not intended to replace advice given to you by your health care provider. Make sure you discuss any questions you have with your health care provider.

## 2014-09-21 ENCOUNTER — Other Ambulatory Visit: Payer: Self-pay | Admitting: *Deleted

## 2014-09-21 MED ORDER — LEVOTHYROXINE SODIUM 25 MCG PO TABS: 25.0000 ug | ORAL_TABLET | Freq: Every day | ORAL | Status: DC

## 2014-10-02 DIAGNOSIS — M170 Bilateral primary osteoarthritis of knee: Secondary | ICD-10-CM | POA: Diagnosis not present

## 2014-11-03 ENCOUNTER — Ambulatory Visit (INDEPENDENT_AMBULATORY_CARE_PROVIDER_SITE_OTHER): Payer: Medicare Other | Admitting: Podiatry

## 2014-11-03 DIAGNOSIS — M79676 Pain in unspecified toe(s): Secondary | ICD-10-CM

## 2014-11-03 DIAGNOSIS — B351 Tinea unguium: Secondary | ICD-10-CM | POA: Diagnosis not present

## 2014-11-04 NOTE — Progress Notes (Signed)
Patient ID: Zachary Ball, male   DOB: Jan 06, 1925, 81 y.o.   MRN: [id number]  Subjective: 81 y.o.-year-old male returns the office today for painful, elongated, thickened toenails. Denies any redness or drainage around the nails. Denies any acute changes since last appointment and no new complaints today. Denies any systemic complaints such as fevers, chills, nausea, vomiting.   Objective: AAO 3, NAD DP/PT pulses palpable, CRT less than 3 seconds Protective sensation intact with Simms Weinstein monofilament, Achilles tendon reflex intact.  Nails hypertrophic, dystrophic, elongated, brittle, discolored 10. There is tenderness overlying these nails 1-5 bilaterally. There is no surrounding erythema or drainage along the nail sites. No open lesions or pre-ulcerative lesions are identified. No other areas of tenderness bilateral lower extremities. No overlying edema, erythema, increased warmth. No pain with calf compression, swelling, warmth, erythema.  Assessment: Patient presents with symptomatic onychomycosis  Plan: -Treatment options including alternatives, risks, complications were discussed -Nails sharply debrided 10 without complication/bleeding. -Discussed daily foot inspection. If there are any changes, to call the office immediately.  -Follow-up in 3 months or sooner if any problems are to arise. In the meantime, encouraged to call the office with any questions, concerns, changes symptoms.

## 2014-11-27 ENCOUNTER — Encounter: Payer: Self-pay | Admitting: Internal Medicine

## 2014-12-21 ENCOUNTER — Ambulatory Visit: Payer: Medicare Other | Admitting: Internal Medicine

## 2014-12-27 ENCOUNTER — Ambulatory Visit (INDEPENDENT_AMBULATORY_CARE_PROVIDER_SITE_OTHER): Payer: Medicare Other | Admitting: Internal Medicine

## 2014-12-27 ENCOUNTER — Encounter: Payer: Self-pay | Admitting: Internal Medicine

## 2014-12-27 VITALS — BP 136/80 | HR 60 | Temp 97.9°F | Resp 20 | Ht 70.0 in | Wt 225.0 lb

## 2014-12-27 DIAGNOSIS — I2510 Atherosclerotic heart disease of native coronary artery without angina pectoris: Secondary | ICD-10-CM

## 2014-12-27 DIAGNOSIS — E785 Hyperlipidemia, unspecified: Secondary | ICD-10-CM

## 2014-12-27 DIAGNOSIS — I359 Nonrheumatic aortic valve disorder, unspecified: Secondary | ICD-10-CM | POA: Diagnosis not present

## 2014-12-27 NOTE — Progress Notes (Signed)
Pre visit review using our clinic review tool, if applicable. No additional management support is needed unless otherwise documented below in the visit note. 

## 2014-12-27 NOTE — Patient Instructions (Signed)
Limit your sodium (Salt) intake  Return in 6 months for follow-up  

## 2014-12-27 NOTE — Progress Notes (Signed)
Subjective:    Patient ID: Zachary Ball, male    DOB: Jan 10, 1925, 81 y.o.   MRN: [id number]  HPI  81 year old patient who is seen today for his biannual follow-up.  He has a history of CAD, which has been stable.  No exertional chest pain.  In general doing quite well. He is also followed the [facility] hospital system.  He has hypothyroidism on low-dose thyroid supplementation.  Remains on atorvastatin for dyslipidemia.  His cardiopulmonary status has been stable.  Past Medical History  Diagnosis Date  . AORTIC STENOSIS, MILD 04/08/2007  . ARTHROSCOPY, KNEE, HX OF 01/07/2007  . BENIGN PROSTATIC HYPERTROPHY 01/07/2007  . CAROTID STENOSIS 03/19/2009  . CORONARY ARTERY DISEASE 01/07/2007    s/p CABG 02/2002  . CORONARY ATHEROSCLEROSIS NATIVE CORONARY ARTERY 03/28/2009  . GERD 01/07/2007  . HYPERLIPIDEMIA 07/02/2007  . Osteoarth NOS-Unspec 01/07/2007  . Mild aortic stenosis   . Anal fissure   . Pneumonia     Hx of   . Cataract     surgery bilateral  . Hx of colonoscopy     History   Social History  . Marital Status: Married    Spouse Name: N/A  . Number of Children: 1  . Years of Education: N/A   Occupational History  . retired Building control surveyor    Social History Main Topics  . Smoking status: Former Smoker    Quit date: 08/25/1978  . Smokeless tobacco: Never Used     Comment: 90 pack year history, quit 30 years ago  . Alcohol Use: No  . Drug Use: No  . Sexual Activity: Not on file   Other Topics Concern  . Not on file   Social History Narrative    Past Surgical History  Procedure Laterality Date  . Coronary artery bypass graft  2003    x 4  . Shoulder surgery  1975    right  . Knee surgery      right; 1994 and 1999  . Colonoscopy      Family History  Problem Relation Age of Onset  . Coronary artery disease Father   . Breast cancer Mother   . Stomach cancer Mother   . Colon cancer Mother   . Diabetes Brother   . Kidney disease Brother     No Known Allergies  Current  Outpatient Prescriptions on File Prior to Visit  Medication Sig Dispense Refill  . albuterol (PROVENTIL HFA;VENTOLIN HFA) 108 (90 BASE) MCG/ACT inhaler Inhale into the lungs every 6 (six) hours as needed for wheezing or shortness of breath.    Marland Kitchen aspirin 81 MG tablet 81 mg daily. Only takes on Monday, Wed, and Friday    . atorvastatin (LIPITOR) 40 MG tablet Take 40 mg by mouth daily.    . clotrimazole-betamethasone (LOTRISONE) cream Apply 1 application topically 2 (two) times daily. 30 g 0  . Glucosamine-Chondroitin (GLUCOSAMINE CHONDR COMPLEX PO) Take by mouth daily.      Marland Kitchen levothyroxine (SYNTHROID, LEVOTHROID) 25 MCG tablet Take 1 tablet (25 mcg total) by mouth daily before breakfast. 90 tablet 1  . Multiple Vitamin (MULTIVITAMIN) tablet Take 1 tablet by mouth daily.      . polyethylene glycol (MIRALAX / GLYCOLAX) packet Take 17 g by mouth daily as needed.       No current facility-administered medications on file prior to visit.    BP 136/80 mmHg  Pulse 60  Temp(Src) 97.9 F (36.6 C) (Oral)  Resp 20  Ht 5\' 10"  (1.778  m)  Wt 225 lb (102.059 kg)  BMI 32.28 kg/m2  SpO2 95%     Review of Systems  Constitutional: Negative for fever, chills, appetite change and fatigue.  HENT: Negative for congestion, dental problem, ear pain, hearing loss, sore throat, tinnitus, trouble swallowing and voice change.   Eyes: Negative for pain, discharge and visual disturbance.  Respiratory: Negative for cough, chest tightness, wheezing and stridor.   Cardiovascular: Positive for leg swelling. Negative for chest pain and palpitations.  Gastrointestinal: Negative for nausea, vomiting, abdominal pain, diarrhea, constipation, blood in stool and abdominal distention.  Genitourinary: Negative for urgency, hematuria, flank pain, discharge, difficulty urinating and genital sores.  Musculoskeletal: Negative for myalgias, back pain, joint swelling, arthralgias, gait problem and neck stiffness.  Skin: Negative  for rash.  Neurological: Negative for dizziness, syncope, speech difficulty, weakness, numbness and headaches.  Hematological: Negative for adenopathy. Does not bruise/bleed easily.  Psychiatric/Behavioral: Negative for behavioral problems and dysphoric mood. The patient is not nervous/anxious.        Objective:   Physical Exam  Constitutional: He is oriented to person, place, and time. He appears well-developed.  HENT:  Head: Normocephalic.  Right Ear: External ear normal.  Left Ear: External ear normal.  Eyes: Conjunctivae and EOM are normal.  Neck: Normal range of motion.  Cardiovascular: Normal rate.   Murmur heard. Grade 3/6 systolic murmur loudest at the base transmitted to the carotid artery distribution  Pulmonary/Chest:  Generally diminished breath sounds  Abdominal: Bowel sounds are normal.  Musculoskeletal: Normal range of motion. He exhibits edema. He exhibits no tenderness.  Neurological: He is alert and oriented to person, place, and time.  Psychiatric: He has a normal mood and affect. His behavior is normal.          Assessment & Plan:   Dyslipidemia.  Continue statin therapy Coronary artery disease, stable Hypothyroidism.  Continue supplementation.  Check TSH next visit COPD stable.  Little albuterol use.

## 2015-01-18 DIAGNOSIS — M170 Bilateral primary osteoarthritis of knee: Secondary | ICD-10-CM | POA: Diagnosis not present

## 2015-01-25 DIAGNOSIS — M170 Bilateral primary osteoarthritis of knee: Secondary | ICD-10-CM | POA: Diagnosis not present

## 2015-02-01 DIAGNOSIS — M170 Bilateral primary osteoarthritis of knee: Secondary | ICD-10-CM | POA: Diagnosis not present

## 2015-02-02 ENCOUNTER — Ambulatory Visit: Payer: Medicare Other | Admitting: Podiatry

## 2015-02-08 ENCOUNTER — Encounter: Payer: Self-pay | Admitting: Podiatry

## 2015-02-08 ENCOUNTER — Ambulatory Visit (INDEPENDENT_AMBULATORY_CARE_PROVIDER_SITE_OTHER): Payer: Medicare Other | Admitting: Podiatry

## 2015-02-08 VITALS — BP 150/78 | HR 63 | Resp 18

## 2015-02-08 DIAGNOSIS — B351 Tinea unguium: Secondary | ICD-10-CM

## 2015-02-08 DIAGNOSIS — M79676 Pain in unspecified toe(s): Secondary | ICD-10-CM

## 2015-02-08 DIAGNOSIS — M170 Bilateral primary osteoarthritis of knee: Secondary | ICD-10-CM | POA: Diagnosis not present

## 2015-02-08 NOTE — Progress Notes (Signed)
Patient ID: Zachary Ball, male   DOB: 1925/05/01, 81 y.o.   MRN: [id number] Complaint:  Visit Type: Patient returns to my office for continued preventative foot care services. Complaint: Patient states" my nails have grown long and thick and become painful to walk and wear shoe. He presents for preventative foot care services. No changes to ROS.  He says his right big toe has been removed three time years ago and 8 months ago his left big toenail came off and now is deformed.  Podiatric Exam: Vascular: dorsalis pedis and posterior tibial pulses are palpable bilateral. Capillary return is immediate. Temperature gradient is WNL. Skin turgor WNL  Sensorium: Normal Semmes Weinstein monofilament test. Normal tactile sensation bilaterally. Nail Exam: Pt has thick disfigured discolored nails with subungual debris noted bilateral entire nail hallux through fifth toenails Ulcer Exam: There is no evidence of ulcer or pre-ulcerative changes or infection. Orthopedic Exam: Muscle tone and strength are WNL. No limitations in general ROM. No crepitus or effusions noted. Foot type and digits show no abnormalities. Bony prominences are unremarkable. Skin: No Porokeratosis. No infection or ulcers  Diagnosis:  Tinea unguium, Pain in right toe, pain in left toes  Treatment & Plan Procedures and Treatment: Consent by patient was obtained for treatment procedures. The patient understood the discussion of treatment and procedures well. All questions were answered thoroughly reviewed. Debridement of mycotic and hypertrophic toenails, 1 through 5 bilateral and clearing of subungual debris. No ulceration, no infection noted.  Return Visit-Office Procedure: Patient instructed to return to the office for a follow up visit 3 months for continued evaluation and treatment.

## 2015-02-15 DIAGNOSIS — M170 Bilateral primary osteoarthritis of knee: Secondary | ICD-10-CM | POA: Diagnosis not present

## 2015-03-20 ENCOUNTER — Ambulatory Visit (INDEPENDENT_AMBULATORY_CARE_PROVIDER_SITE_OTHER): Payer: Medicare Other | Admitting: Internal Medicine

## 2015-03-20 ENCOUNTER — Encounter: Payer: Self-pay | Admitting: Internal Medicine

## 2015-03-20 VITALS — BP 130/78 | HR 78 | Temp 98.3°F | Resp 20 | Ht 70.0 in | Wt 223.0 lb

## 2015-03-20 DIAGNOSIS — E038 Other specified hypothyroidism: Secondary | ICD-10-CM

## 2015-03-20 DIAGNOSIS — E785 Hyperlipidemia, unspecified: Secondary | ICD-10-CM | POA: Diagnosis not present

## 2015-03-20 DIAGNOSIS — I359 Nonrheumatic aortic valve disorder, unspecified: Secondary | ICD-10-CM

## 2015-03-20 DIAGNOSIS — I2510 Atherosclerotic heart disease of native coronary artery without angina pectoris: Secondary | ICD-10-CM

## 2015-03-20 DIAGNOSIS — E034 Atrophy of thyroid (acquired): Secondary | ICD-10-CM

## 2015-03-20 DIAGNOSIS — E039 Hypothyroidism, unspecified: Secondary | ICD-10-CM | POA: Insufficient documentation

## 2015-03-20 NOTE — Progress Notes (Signed)
Pre visit review using our clinic review tool, if applicable. No additional management support is needed unless otherwise documented below in the visit note. 

## 2015-03-20 NOTE — Progress Notes (Signed)
Subjective:    Patient ID: Zachary Ball, male    DOB: December 11, 1924, 81 y.o.   MRN: [id number]  HPI  81 year old patient who is seen today for his six-month follow-up.  He has a history of coronary artery disease which has been stable.  Status post CABG.  He was seen  6 months ago.  Carotid artery Doppler studies were performed due to bilateral carotid bruits.  This study was normal.  He does have a history of AS and a systolic murmur at the base, likely transmitted to the carotid distribution.  No focal neurological symptoms.  Denies any exertional chest pain.   Laboratory screen revealed a slightly elevated TSH.  And he is now on low-dose levothyroxine supplementation.  He complains some mild fatigue, but in general doing quite well at 89.  Past Medical History  Diagnosis Date  . AORTIC STENOSIS, MILD 04/08/2007  . ARTHROSCOPY, KNEE, HX OF 01/07/2007  . BENIGN PROSTATIC HYPERTROPHY 01/07/2007  . CAROTID STENOSIS 03/19/2009  . CORONARY ARTERY DISEASE 01/07/2007    s/p CABG 02/2002  . CORONARY ATHEROSCLEROSIS NATIVE CORONARY ARTERY 03/28/2009  . GERD 01/07/2007  . HYPERLIPIDEMIA 07/02/2007  . Osteoarth NOS-Unspec 01/07/2007  . Mild aortic stenosis   . Anal fissure   . Pneumonia     Hx of   . Cataract     surgery bilateral  . Hx of colonoscopy     History   Social History  . Marital Status: Married    Spouse Name: N/A  . Number of Children: 1  . Years of Education: N/A   Occupational History  . retired Building control surveyor    Social History Main Topics  . Smoking status: Former Smoker    Quit date: 08/25/1978  . Smokeless tobacco: Never Used     Comment: 90 pack year history, quit 30 years ago  . Alcohol Use: No  . Drug Use: No  . Sexual Activity: Not on file   Other Topics Concern  . Not on file   Social History Narrative    Past Surgical History  Procedure Laterality Date  . Coronary artery bypass graft  2003    x 4  . Shoulder surgery  1975    right  . Knee surgery     right; 1994 and 1999  . Colonoscopy      Family History  Problem Relation Age of Onset  . Coronary artery disease Father   . Breast cancer Mother   . Stomach cancer Mother   . Colon cancer Mother   . Diabetes Brother   . Kidney disease Brother     No Known Allergies  Current Outpatient Prescriptions on File Prior to Visit  Medication Sig Dispense Refill  . albuterol (PROVENTIL HFA;VENTOLIN HFA) 108 (90 BASE) MCG/ACT inhaler Inhale into the lungs every 6 (six) hours as needed for wheezing or shortness of breath.    Marland Kitchen aspirin 81 MG tablet 81 mg daily. Only takes on Monday, Wed, and Friday    . atorvastatin (LIPITOR) 40 MG tablet Take 40 mg by mouth daily.    . clotrimazole-betamethasone (LOTRISONE) cream Apply 1 application topically 2 (two) times daily. 30 g 0  . Glucosamine-Chondroitin (GLUCOSAMINE CHONDR COMPLEX PO) Take by mouth daily.      Marland Kitchen levothyroxine (SYNTHROID, LEVOTHROID) 25 MCG tablet Take 1 tablet (25 mcg total) by mouth daily before breakfast. 90 tablet 1  . Multiple Vitamin (MULTIVITAMIN) tablet Take 1 tablet by mouth daily.      Marland Kitchen  polyethylene glycol (MIRALAX / GLYCOLAX) packet Take 17 g by mouth daily as needed.       No current facility-administered medications on file prior to visit.    BP 130/78 mmHg  Pulse 78  Temp(Src) 98.3 F (36.8 C) (Oral)  Resp 20  Ht 5\' 10"  (1.778 m)  Wt 223 lb (101.152 kg)  BMI 32.00 kg/m2  SpO2 94%      Review of Systems  Constitutional: Positive for fatigue. Negative for fever, chills and appetite change.  HENT: Negative for congestion, dental problem, ear pain, hearing loss, sore throat, tinnitus, trouble swallowing and voice change.   Eyes: Negative for pain, discharge and visual disturbance.  Respiratory: Negative for cough, chest tightness, wheezing and stridor.   Cardiovascular: Negative for chest pain, palpitations and leg swelling.  Gastrointestinal: Negative for nausea, vomiting, abdominal pain, diarrhea,  constipation, blood in stool and abdominal distention.  Genitourinary: Negative for urgency, hematuria, flank pain, discharge, difficulty urinating and genital sores.  Musculoskeletal: Negative for myalgias, back pain, joint swelling, arthralgias, gait problem and neck stiffness.  Skin: Negative for rash.  Neurological: Positive for weakness. Negative for dizziness, syncope, speech difficulty, numbness and headaches.  Hematological: Negative for adenopathy. Does not bruise/bleed easily.  Psychiatric/Behavioral: Negative for behavioral problems and dysphoric mood. The patient is not nervous/anxious.        Objective:   Physical Exam  Constitutional: He is oriented to person, place, and time. He appears well-developed.  HENT:  Head: Normocephalic.  Right Ear: External ear normal.  Left Ear: External ear normal.  Eyes: Conjunctivae and EOM are normal.  Neck: Normal range of motion.  Cardiovascular: Normal rate.   Murmur heard. Grade 3/6 systolic murmur radiated to the carotid artery distribution  Pulmonary/Chest: Breath sounds normal.  Abdominal: Bowel sounds are normal.  Musculoskeletal: Normal range of motion. He exhibits no edema or tenderness.  Neurological: He is alert and oriented to person, place, and time.  Psychiatric: He has a normal mood and affect. His behavior is normal.          Assessment & Plan:   Coronary artery disease, stable  Aortic stenosis.  Mild and stable  Dyslipidemia.  Continue statin therapy  Hypothyroidism.  Continue levothyroxine supplementation.  We'll check a TSH   CPX 6 months

## 2015-03-20 NOTE — Patient Instructions (Signed)
Limit your sodium (Salt) intake    It is important that you exercise regularly, at least 20 minutes 3 to 4 times per week.  If you develop chest pain or shortness of breath seek  medical attention.  Please check your blood pressure on a regular basis.  If it is consistently greater than 150/90, please make an office appointment.  Return in 6 months for follow-up   

## 2015-05-09 DIAGNOSIS — Z23 Encounter for immunization: Secondary | ICD-10-CM | POA: Diagnosis not present

## 2015-05-17 ENCOUNTER — Encounter: Payer: Self-pay | Admitting: Podiatry

## 2015-05-17 ENCOUNTER — Ambulatory Visit (INDEPENDENT_AMBULATORY_CARE_PROVIDER_SITE_OTHER): Payer: Medicare Other | Admitting: Podiatry

## 2015-05-17 DIAGNOSIS — M79676 Pain in unspecified toe(s): Secondary | ICD-10-CM

## 2015-05-17 DIAGNOSIS — B351 Tinea unguium: Secondary | ICD-10-CM | POA: Diagnosis not present

## 2015-05-17 NOTE — Progress Notes (Signed)
Patient ID: Zachary Ball, male   DOB: 11/11/1924, 81 y.o.   MRN: [id number] Complaint:  Visit Type: Patient returns to my office for continued preventative foot care services. Complaint: Patient states" my nails have grown long and thick and become painful to walk and wear shoe. He presents for preventative foot care services. No changes to ROS.  He says his right big toe has been removed three time years ago and 8 months ago his left big toenail came off and now is deformed.  Podiatric Exam: Vascular: dorsalis pedis and posterior tibial pulses are palpable bilateral. Capillary return is immediate. Temperature gradient is WNL. Skin turgor WNL  Sensorium: Normal Semmes Weinstein monofilament test. Normal tactile sensation bilaterally. Nail Exam: Pt has thick disfigured discolored nails with subungual debris noted bilateral entire nail hallux through fifth toenails Ulcer Exam: There is no evidence of ulcer or pre-ulcerative changes or infection. Orthopedic Exam: Muscle tone and strength are WNL. No limitations in general ROM. No crepitus or effusions noted. Foot type and digits show no abnormalities. Bony prominences are unremarkable. Skin: No Porokeratosis. No infection or ulcers  Diagnosis:  Tinea unguium, Pain in right toe, pain in left toes  Treatment & Plan Procedures and Treatment: Consent by patient was obtained for treatment procedures. The patient understood the discussion of treatment and procedures well. All questions were answered thoroughly reviewed. Debridement of mycotic and hypertrophic toenails, 1 through 5 bilateral and clearing of subungual debris. No ulceration, no infection noted.  Return Visit-Office Procedure: Patient instructed to return to the office for a follow up visit 3 months for continued evaluation and treatment. 

## 2015-06-20 DIAGNOSIS — H4720 Unspecified optic atrophy: Secondary | ICD-10-CM | POA: Diagnosis not present

## 2015-06-20 DIAGNOSIS — H35363 Drusen (degenerative) of macula, bilateral: Secondary | ICD-10-CM | POA: Diagnosis not present

## 2015-06-20 DIAGNOSIS — H353131 Nonexudative age-related macular degeneration, bilateral, early dry stage: Secondary | ICD-10-CM | POA: Diagnosis not present

## 2015-06-26 DIAGNOSIS — H353132 Nonexudative age-related macular degeneration, bilateral, intermediate dry stage: Secondary | ICD-10-CM | POA: Diagnosis not present

## 2015-06-26 DIAGNOSIS — Z961 Presence of intraocular lens: Secondary | ICD-10-CM | POA: Diagnosis not present

## 2015-06-29 ENCOUNTER — Encounter: Payer: Self-pay | Admitting: Internal Medicine

## 2015-06-29 ENCOUNTER — Telehealth: Payer: Self-pay | Admitting: Internal Medicine

## 2015-06-29 ENCOUNTER — Ambulatory Visit (INDEPENDENT_AMBULATORY_CARE_PROVIDER_SITE_OTHER): Payer: Medicare Other | Admitting: Internal Medicine

## 2015-06-29 VITALS — BP 150/90 | HR 71 | Temp 98.2°F | Resp 20 | Ht 70.0 in | Wt 220.0 lb

## 2015-06-29 DIAGNOSIS — E039 Hypothyroidism, unspecified: Secondary | ICD-10-CM | POA: Diagnosis not present

## 2015-06-29 DIAGNOSIS — I2510 Atherosclerotic heart disease of native coronary artery without angina pectoris: Secondary | ICD-10-CM

## 2015-06-29 DIAGNOSIS — I359 Nonrheumatic aortic valve disorder, unspecified: Secondary | ICD-10-CM | POA: Diagnosis not present

## 2015-06-29 MED ORDER — LEVOTHYROXINE SODIUM 25 MCG PO TABS: 25.0000 ug | ORAL_TABLET | Freq: Every day | ORAL | Status: AC

## 2015-06-29 NOTE — Telephone Encounter (Signed)
Pt request refill of the following: levothyroxine (SYNTHROID, LEVOTHROID) 25 MCG tablet  Pt said he only had about 5 pills left      Phamacy: [facility]

## 2015-06-29 NOTE — Progress Notes (Signed)
Subjective:    Patient ID: Zachary Ball, male    DOB: [birth year], 81 y.o.   MRN: [id number]  HPI  81 year old patient who is seen today for his biannual follow-up.  He has a history of coronary artery disease.  Remains asymptomatic.  He has history mild a S.  He has dyslipidemia and hypothyroidism.  No concerns or complaints.  Denies any exertional chest pain or shortness of breath  Past Medical History  Diagnosis Date  . AORTIC STENOSIS, MILD 04/08/2007  . ARTHROSCOPY, KNEE, HX OF 01/07/2007  . BENIGN PROSTATIC HYPERTROPHY 01/07/2007  . CAROTID STENOSIS 03/19/2009  . CORONARY ARTERY DISEASE 01/07/2007    s/p CABG 02/2002  . CORONARY ATHEROSCLEROSIS NATIVE CORONARY ARTERY 03/28/2009  . GERD 01/07/2007  . HYPERLIPIDEMIA 07/02/2007  . Osteoarth NOS-Unspec 01/07/2007  . Mild aortic stenosis   . Anal fissure   . Pneumonia     Hx of   . Cataract     surgery bilateral  . Hx of colonoscopy     Social History   Social History  . Marital Status: Married    Spouse Name: N/A  . Number of Children: 1  . Years of Education: N/A   Occupational History  . retired Building control surveyor    Social History Main Topics  . Smoking status: Former Smoker    Quit date: 08/25/1978  . Smokeless tobacco: Never Used     Comment: 90 pack year history, quit 30 years ago  . Alcohol Use: No  . Drug Use: No  . Sexual Activity: Not on file   Other Topics Concern  . Not on file   Social History Narrative    Past Surgical History  Procedure Laterality Date  . Coronary artery bypass graft  2003    x 4  . Shoulder surgery  1975    right  . Knee surgery      right; 1994 and 1999  . Colonoscopy      Family History  Problem Relation Age of Onset  . Coronary artery disease Father   . Breast cancer Mother   . Stomach cancer Mother   . Colon cancer Mother   . Diabetes Brother   . Kidney disease Brother     No Known Allergies  Current Outpatient Prescriptions on File Prior to Visit  Medication Sig  Dispense Refill  . albuterol (PROVENTIL HFA;VENTOLIN HFA) 108 (90 BASE) MCG/ACT inhaler Inhale into the lungs every 6 (six) hours as needed for wheezing or shortness of breath.    Marland Kitchen aspirin 81 MG tablet 81 mg daily. Only takes on Monday, Wed, and Friday    . atorvastatin (LIPITOR) 40 MG tablet Take 40 mg by mouth daily.    . clotrimazole-betamethasone (LOTRISONE) cream Apply 1 application topically 2 (two) times daily. 30 g 0  . Glucosamine-Chondroitin (GLUCOSAMINE CHONDR COMPLEX PO) Take by mouth daily.      Marland Kitchen levothyroxine (SYNTHROID, LEVOTHROID) 25 MCG tablet Take 1 tablet (25 mcg total) by mouth daily before breakfast. 90 tablet 1  . Multiple Vitamin (MULTIVITAMIN) tablet Take 1 tablet by mouth daily.      . polyethylene glycol (MIRALAX / GLYCOLAX) packet Take 17 g by mouth daily as needed.       No current facility-administered medications on file prior to visit.    BP 150/90 mmHg  Pulse 71  Temp(Src) 98.2 F (36.8 C) (Oral)  Resp 20  Ht 5\' 10"  (1.778 m)  Wt 220 lb (99.791 kg)  BMI 31.57  kg/m2  SpO2 95%     Review of Systems  Constitutional: Negative for fever, chills, appetite change and fatigue.  HENT: Negative for congestion, dental problem, ear pain, hearing loss, sore throat, tinnitus, trouble swallowing and voice change.   Eyes: Negative for pain, discharge and visual disturbance.  Respiratory: Negative for cough, chest tightness, wheezing and stridor.   Cardiovascular: Negative for chest pain, palpitations and leg swelling.  Gastrointestinal: Negative for nausea, vomiting, abdominal pain, diarrhea, constipation, blood in stool and abdominal distention.  Genitourinary: Negative for urgency, hematuria, flank pain, discharge, difficulty urinating and genital sores.  Musculoskeletal: Negative for myalgias, back pain, joint swelling, arthralgias, gait problem and neck stiffness.  Skin: Negative for rash.  Neurological: Negative for dizziness, syncope, speech difficulty,  weakness, numbness and headaches.  Hematological: Negative for adenopathy. Does not bruise/bleed easily.  Psychiatric/Behavioral: Negative for behavioral problems and dysphoric mood. The patient is not nervous/anxious.        Objective:   Physical Exam  Constitutional: He is oriented to person, place, and time. He appears well-developed.  HENT:  Head: Normocephalic.  Right Ear: External ear normal.  Left Ear: External ear normal.  Eyes: Conjunctivae and EOM are normal.  Neck: Normal range of motion.  Cardiovascular: Normal rate.   Murmur heard. Pulmonary/Chest: He has rales.  Abdominal: Bowel sounds are normal.  Musculoskeletal: Normal range of motion. He exhibits edema. He exhibits no tenderness.  Neurological: He is alert and oriented to person, place, and time.  Psychiatric: He has a normal mood and affect. His behavior is normal.          Assessment & Plan:   Coronary artery disease.  Remains asymptomatic Mild aortic stenosis Hypothyroidism.  We'll check TSH next office visit  Dyslipidemia.  Continue statin therapy

## 2015-06-29 NOTE — Progress Notes (Signed)
Pre visit review using our clinic review tool, if applicable. No additional management support is needed unless otherwise documented below in the visit note. 

## 2015-06-29 NOTE — Patient Instructions (Signed)
Limit your sodium (Salt) intake  Return in 6 months for follow-up  

## 2015-06-29 NOTE — Telephone Encounter (Signed)
Rx sent to pharmacy   

## 2015-07-16 DIAGNOSIS — M170 Bilateral primary osteoarthritis of knee: Secondary | ICD-10-CM | POA: Diagnosis not present

## 2015-07-16 DIAGNOSIS — M1712 Unilateral primary osteoarthritis, left knee: Secondary | ICD-10-CM | POA: Diagnosis not present

## 2015-07-16 DIAGNOSIS — M1711 Unilateral primary osteoarthritis, right knee: Secondary | ICD-10-CM | POA: Diagnosis not present

## 2015-08-10 DIAGNOSIS — M170 Bilateral primary osteoarthritis of knee: Secondary | ICD-10-CM | POA: Diagnosis not present

## 2015-08-10 NOTE — Progress Notes (Unsigned)
   Subjective:    Patient ID: Zachary Ball, male    DOB: August 03, 1925, 81 y.o.   MRN: [id number]  HPI    Review of Systems     Objective:   Physical Exam        Assessment & Plan:

## 2015-08-28 ENCOUNTER — Telehealth: Payer: Self-pay | Admitting: Cardiology

## 2015-08-28 NOTE — Telephone Encounter (Signed)
Pt is going to have knee surgery,she mailed a clearance to you about 2 weeks ago. They still have not received it. Please fax it back asap,Dr Hart Robinsons is the surgeon at [facility].Fax [phone].

## 2015-08-28 NOTE — Telephone Encounter (Signed)
Spoke with pt wife, letting her know pt have not been seen in office since 2012 and pt will need to be seen in order to be cleared for surgery, Dr Percival Spanish don't have any opening until February, pt will be seen by another cardiologist, message route to scheduler to schedule appt.Zachary Ball

## 2015-08-30 ENCOUNTER — Ambulatory Visit (INDEPENDENT_AMBULATORY_CARE_PROVIDER_SITE_OTHER): Payer: Medicare Other | Admitting: Podiatry

## 2015-08-30 ENCOUNTER — Encounter: Payer: Self-pay | Admitting: Podiatry

## 2015-08-30 DIAGNOSIS — B351 Tinea unguium: Secondary | ICD-10-CM | POA: Diagnosis not present

## 2015-08-30 DIAGNOSIS — M79676 Pain in unspecified toe(s): Secondary | ICD-10-CM

## 2015-08-30 NOTE — Progress Notes (Signed)
Patient ID: Zachary Ball, male   DOB: 1925-06-30, 82 y.o.   MRN: [id number] Complaint:  Visit Type: Patient returns to my office for continued preventative foot care services. Complaint: Patient states" my nails have grown long and thick and become painful to walk and wear shoe. He presents for preventative foot care services. No changes to ROS.  He says his right big toe has been removed three time years ago and 8 months ago his left big toenail came off and now is deformed.  Podiatric Exam: Vascular: dorsalis pedis and posterior tibial pulses are palpable bilateral. Capillary return is immediate. Temperature gradient is WNL. Skin turgor WNL  Sensorium: Normal Semmes Weinstein monofilament test. Normal tactile sensation bilaterally. Nail Exam: Pt has thick disfigured discolored nails with subungual debris noted bilateral entire nail hallux through fifth toenails Ulcer Exam: There is no evidence of ulcer or pre-ulcerative changes or infection. Orthopedic Exam: Muscle tone and strength are WNL. No limitations in general ROM. No crepitus or effusions noted. Foot type and digits show no abnormalities. Bony prominences are unremarkable. Skin: No Porokeratosis. No infection or ulcers  Diagnosis:  Tinea unguium, Pain in right toe, pain in left toes  Treatment & Plan Procedures and Treatment: Consent by patient was obtained for treatment procedures. The patient understood the discussion of treatment and procedures well. All questions were answered thoroughly reviewed. Debridement of mycotic and hypertrophic toenails, 1 through 5 bilateral and clearing of subungual debris. No ulceration, no infection noted.  Return Visit-Office Procedure: Patient instructed to return to the office for a follow up visit 3 months for continued evaluation and treatment.   Gardiner Barefoot DPM

## 2015-09-04 ENCOUNTER — Ambulatory Visit: Payer: PRIVATE HEALTH INSURANCE | Admitting: Cardiovascular Disease

## 2015-09-05 DIAGNOSIS — L821 Other seborrheic keratosis: Secondary | ICD-10-CM | POA: Diagnosis not present

## 2015-09-05 DIAGNOSIS — L820 Inflamed seborrheic keratosis: Secondary | ICD-10-CM | POA: Diagnosis not present

## 2015-09-11 DIAGNOSIS — L821 Other seborrheic keratosis: Secondary | ICD-10-CM | POA: Diagnosis not present

## 2015-09-18 ENCOUNTER — Other Ambulatory Visit: Payer: Self-pay | Admitting: Cardiology

## 2015-09-18 DIAGNOSIS — I6523 Occlusion and stenosis of bilateral carotid arteries: Secondary | ICD-10-CM

## 2015-09-24 ENCOUNTER — Ambulatory Visit (INDEPENDENT_AMBULATORY_CARE_PROVIDER_SITE_OTHER): Payer: Medicare Other | Admitting: Internal Medicine

## 2015-09-24 ENCOUNTER — Encounter: Payer: Self-pay | Admitting: Internal Medicine

## 2015-09-24 VITALS — BP 138/80 | HR 67 | Temp 97.7°F | Resp 20 | Ht 70.0 in | Wt 220.0 lb

## 2015-09-24 DIAGNOSIS — E785 Hyperlipidemia, unspecified: Secondary | ICD-10-CM | POA: Diagnosis not present

## 2015-09-24 DIAGNOSIS — M8949 Other hypertrophic osteoarthropathy, multiple sites: Secondary | ICD-10-CM

## 2015-09-24 DIAGNOSIS — I2510 Atherosclerotic heart disease of native coronary artery without angina pectoris: Secondary | ICD-10-CM

## 2015-09-24 DIAGNOSIS — M150 Primary generalized (osteo)arthritis: Secondary | ICD-10-CM | POA: Diagnosis not present

## 2015-09-24 DIAGNOSIS — Z0000 Encounter for general adult medical examination without abnormal findings: Secondary | ICD-10-CM

## 2015-09-24 DIAGNOSIS — M159 Polyosteoarthritis, unspecified: Secondary | ICD-10-CM

## 2015-09-24 DIAGNOSIS — E039 Hypothyroidism, unspecified: Secondary | ICD-10-CM

## 2015-09-24 DIAGNOSIS — I6523 Occlusion and stenosis of bilateral carotid arteries: Secondary | ICD-10-CM | POA: Diagnosis not present

## 2015-09-24 LAB — LIPID PANEL
Cholesterol: 126 mg/dL (ref 0–200)
HDL: 52.9 mg/dL (ref >39.00)
LDL Cholesterol: 56 mg/dL (ref 0–99)
NonHDL: 72.75
Total CHOL/HDL Ratio: 2
Triglycerides: 84 mg/dL (ref 0.0–149.0)
VLDL: 16.8 mg/dL (ref 0.0–40.0)

## 2015-09-24 LAB — CBC WITH DIFFERENTIAL/PLATELET
BASOS ABS: 0 10*3/uL (ref 0.0–0.1)
Basophils Relative: 0.3 % (ref 0.0–3.0)
Eosinophils Absolute: 0.2 10*3/uL (ref 0.0–0.7)
Eosinophils Relative: 2.5 % (ref 0.0–5.0)
HEMATOCRIT: 45.5 % (ref 39.0–52.0)
Hemoglobin: 15 g/dL (ref 13.0–17.0)
LYMPHS ABS: 1.7 10*3/uL (ref 0.7–4.0)
Lymphocytes Relative: 18.1 % (ref 12.0–46.0)
MCHC: 32.9 g/dL (ref 30.0–36.0)
MCV: 92.1 fl (ref 78.0–100.0)
MONO ABS: 0.6 10*3/uL (ref 0.1–1.0)
Monocytes Relative: 6.9 % (ref 3.0–12.0)
NEUTROS ABS: 6.6 10*3/uL (ref 1.4–7.7)
Neutrophils Relative %: 72.2 % (ref 43.0–77.0)
PLATELETS: 201 10*3/uL (ref 150.0–400.0)
RBC: 4.94 Mil/uL (ref 4.22–5.81)
RDW: 15.3 % (ref 11.5–15.5)
WBC: 9.2 10*3/uL (ref 4.0–10.5)

## 2015-09-24 LAB — COMPREHENSIVE METABOLIC PANEL
ALK PHOS: 68 U/L (ref 39–117)
ALT: 13 U/L (ref 0–53)
AST: 22 U/L (ref 0–37)
Albumin: 4.1 g/dL (ref 3.5–5.2)
BUN: 19 mg/dL (ref 6–23)
CO2: 27 meq/L (ref 19–32)
Calcium: 9.3 mg/dL (ref 8.4–10.5)
Chloride: 105 mEq/L (ref 96–112)
Creatinine, Ser: 0.96 mg/dL (ref 0.40–1.50)
GFR: 78.45 mL/min (ref >60.00)
GLUCOSE: 91 mg/dL (ref 70–99)
POTASSIUM: 4.4 meq/L (ref 3.5–5.1)
SODIUM: 142 meq/L (ref 135–145)
TOTAL PROTEIN: 6.8 g/dL (ref 6.0–8.3)
Total Bilirubin: 0.8 mg/dL (ref 0.2–1.2)

## 2015-09-24 LAB — TSH: TSH: 4.16 u[IU]/mL (ref 0.35–4.50)

## 2015-09-24 NOTE — Progress Notes (Signed)
Pre visit review using our clinic review tool, if applicable. No additional management support is needed unless otherwise documented below in the visit note. 

## 2015-09-24 NOTE — Patient Instructions (Signed)
Limit your sodium (Salt) intake  Cardiology consultation as scheduled  Heart-Healthy Eating Plan Heart-healthy meal planning includes:  Limiting unhealthy fats.  Increasing healthy fats.  Making other small dietary changes. You may need to talk with your doctor or a diet specialist (dietitian) to create an eating plan that is right for you. WHAT TYPES OF FAT SHOULD I CHOOSE?  Choose healthy fats. These include olive oil and canola oil, flaxseeds, walnuts, almonds, and seeds.  Eat more omega-3 fats. These include salmon, mackerel, sardines, tuna, flaxseed oil, and ground flaxseeds. Try to eat fish at least twice each week.  Limit saturated fats.  Saturated fats are often found in animal products, such as meats, butter, and cream.  Plant sources of saturated fats include palm oil, palm kernel oil, and coconut oil.  Avoid foods with partially hydrogenated oils in them. These include stick margarine, some tub margarines, cookies, crackers, and other baked goods. These contain trans fats. WHAT GENERAL GUIDELINES DO I NEED TO FOLLOW?  Check food labels carefully. Identify foods with trans fats or high amounts of saturated fat.  Fill one half of your plate with vegetables and green salads. Eat 4-5 servings of vegetables per day. A serving of vegetables is:  1 cup of raw leafy vegetables.   cup of raw or cooked cut-up vegetables.   cup of vegetable juice.  Fill one fourth of your plate with whole grains. Look for the word "whole" as the first word in the ingredient list.  Fill one fourth of your plate with lean protein foods.  Eat 4-5 servings of fruit per day. A serving of fruit is:  One medium whole fruit.   cup of dried fruit.   cup of fresh, frozen, or canned fruit.   cup of 100% fruit juice.  Eat more foods that contain soluble fiber. These include apples, broccoli, carrots, beans, peas, and barley. Try to get 20-30 g of fiber per day.  Eat more home-cooked  food. Eat less restaurant, buffet, and fast food.  Limit or avoid alcohol.  Limit foods high in starch and sugar.  Avoid fried foods.  Avoid frying your food. Try baking, boiling, grilling, or broiling it instead. You can also reduce fat by:  Removing the skin from poultry.  Removing all visible fats from meats.  Skimming the fat off of stews, soups, and gravies before serving them.  Steaming vegetables in water or broth.  Lose weight if you are overweight.  Eat 4-5 servings of nuts, legumes, and seeds per week:  One serving of dried beans or legumes equals  cup after being cooked.  One serving of nuts equals 1 ounces.  One serving of seeds equals  ounce or one tablespoon.  You may need to keep track of how much salt or sodium you eat. This is especially true if you have high blood pressure. Talk with your doctor or dietitian to get more information. WHAT FOODS CAN I EAT? Grains Breads, including [place], white, pita, wheat, raisin, rye, oatmeal, and [place]. Tortillas that are neither fried nor made with lard or trans fat. Low-fat rolls, including hotdog and hamburger buns and English muffins. Biscuits. Muffins. Waffles. Pancakes. Light popcorn. Whole-grain cereals. Flatbread. Melba toast. Pretzels. Breadsticks. Rusks. Low-fat snacks. Low-fat crackers, including oyster, saltine, matzo, graham, animal, and rye. Rice and pasta, including brown rice and pastas that are made with whole wheat.  Vegetables All vegetables.  Fruits All fruits, but limit coconut. Meats and Other Protein Sources Lean, well-trimmed beef, veal,  pork, and lamb. Chicken and [place] without skin. All fish and shellfish. Wild duck, rabbit, pheasant, and venison. Egg whites or low-cholesterol egg substitutes. Dried beans, peas, lentils, and tofu. Seeds and most nuts. Dairy Low-fat or nonfat cheeses, including ricotta, string, and mozzarella. Skim or 1% milk that is liquid, powdered, or evaporated. Buttermilk  that is made with low-fat milk. Nonfat or low-fat yogurt. Beverages Mineral water. Diet carbonated beverages. Sweets and Desserts Sherbets and fruit ices. Honey, jam, marmalade, jelly, and syrups. Meringues and gelatins. Pure sugar candy, such as hard candy, jelly beans, gumdrops, mints, marshmallows, and small amounts of dark chocolate. [employer] Eat all sweets and desserts in moderation. Fats and Oils Nonhydrogenated (trans-free) margarines. Vegetable oils, including soybean, sesame, sunflower, olive, peanut, safflower, corn, canola, and cottonseed. Salad dressings or mayonnaise made with a vegetable oil. Limit added fats and oils that you use for cooking, baking, salads, and as spreads. Other Cocoa powder. Coffee and tea. All seasonings and condiments. The items listed above may not be a complete list of recommended foods or beverages. Contact your dietitian for more options. WHAT FOODS ARE NOT RECOMMENDED? Grains Breads that are made with saturated or trans fats, oils, or whole milk. Croissants. Butter rolls. Cheese breads. Sweet rolls. Donuts. Buttered popcorn. Chow mein noodles. High-fat crackers, such as cheese or butter crackers. Meats and Other Protein Sources Fatty meats, such as hotdogs, short ribs, sausage, spareribs, bacon, rib eye roast or steak, and mutton. High-fat deli meats, such as salami and bologna. Caviar. Domestic duck and goose. Organ meats, such as kidney, liver, sweetbreads, and heart. Dairy Cream, sour cream, cream cheese, and creamed cottage cheese. Whole-milk cheeses, including blue (bleu), Monterey Jack, [place], [place], American, [place], Swiss, cheddar, [place], and [place]. Whole or 2% milk that is liquid, evaporated, or condensed. Whole buttermilk. Cream sauce or high-fat cheese sauce. Yogurt that is made from whole milk. Beverages Regular sodas and juice drinks with added sugar. Sweets and Desserts Frosting. Pudding. Cookies. Cakes other than angel food  cake. Candy that has milk chocolate or white chocolate, hydrogenated fat, butter, coconut, or unknown ingredients. Buttered syrups. Full-fat ice cream or ice cream drinks. Fats and Oils Gravy that has suet, meat fat, or shortening. Cocoa butter, hydrogenated oils, palm oil, coconut oil, palm kernel oil. These can often be found in baked products, candy, fried foods, nondairy creamers, and whipped toppings. Solid fats and shortenings, including bacon fat, salt pork, lard, and butter. Nondairy cream substitutes, such as coffee creamers and sour cream substitutes. Salad dressings that are made of unknown oils, cheese, or sour cream. The items listed above may not be a complete list of foods and beverages to avoid. Contact your dietitian for more information.   This information is not intended to replace advice given to you by your health care provider. Make sure you discuss any questions you have with your health care provider.   Document Released: 02/10/2012 Document Revised: 09/01/2014 Document Reviewed: 02/02/2014 Elsevier Interactive Patient Education [employer].

## 2015-09-24 NOTE — Progress Notes (Signed)
Subjective:    Patient ID: Zachary Ball, male    DOB: [birth year], 82 y.o.   MRN: [id number]  HPI    82 year-old patient seen today for a wellness exam.    He has a history of coronary artery disease and mild aortic stenosis.  He is followed by cardiology.  Doing quite well today without concerns or complaints.  He has decreased visual acuity involving the left eye and has been followed by Dr. Zadie Rhine.  He has knee arthritis and is planning a left total knee replacement surgery by Dr. Theda Sers.  He has cardiology follow-up February 6 for preoperative clearance.  He denies any exertional chest pain or shortness of breath, but has been very inactive through the winter and limited by his knee pain.    Alcohol-Tobacco  Smoking Status: quit  Allergies (verified): No Known Drug Allergies  Past History:  Past Medical History: Coronary artery disease status post CABG July 2003 GERD Benign prostatic hypertrophy Hyperlipidemia Osteoarthritis mild aortic stenosis diminished auditory acuity  Past Surgical History:   Coronary artery bypass graft (2003)   Family History:   Father coronary artery disease. Mother died at 53 of breast cancer  6 brothers, positive for diabetes, chronic kidney disease  3 sisters  Social History:   Married  Past Medical History  Diagnosis Date  . AORTIC STENOSIS, MILD 04/08/2007  . ARTHROSCOPY, KNEE, HX OF 01/07/2007  . BENIGN PROSTATIC HYPERTROPHY 01/07/2007  . CAROTID STENOSIS 03/19/2009  . CORONARY ARTERY DISEASE 01/07/2007    s/p CABG 02/2002  . CORONARY ATHEROSCLEROSIS NATIVE CORONARY ARTERY 03/28/2009  . GERD 01/07/2007  . HYPERLIPIDEMIA 07/02/2007  . Osteoarth NOS-Unspec 01/07/2007  . Mild aortic stenosis   . Anal fissure   . Pneumonia     Hx of   . Cataract     surgery bilateral  . Hx of colonoscopy     Social History   Social History  . Marital Status: Married    Spouse Name: N/A  . Number of  Children: 1  . Years of Education: N/A   Occupational History  . retired Building control surveyor    Social History Main Topics  . Smoking status: Former Smoker    Quit date: 08/25/1978  . Smokeless tobacco: Never Used     Comment: 90 pack year history, quit 30 years ago  . Alcohol Use: No  . Drug Use: No  . Sexual Activity: Not on file   Other Topics Concern  . Not on file   Social History Narrative    Past Surgical History  Procedure Laterality Date  . Coronary artery bypass graft  2003    x 4  . Shoulder surgery  1975    right  . Knee surgery      right; 1994 and 1999  . Colonoscopy      Family History  Problem Relation Age of Onset  . Coronary artery disease Father   . Breast cancer Mother   . Stomach cancer Mother   . Colon cancer Mother   . Diabetes Brother   . Kidney disease Brother     No Known Allergies  Current Outpatient Prescriptions on File Prior to Visit  Medication Sig Dispense Refill  . albuterol (PROVENTIL HFA;VENTOLIN HFA) 108 (90 BASE) MCG/ACT inhaler Inhale into the lungs every 6 (six) hours as needed for wheezing or shortness of breath.    Marland Kitchen aspirin 81 MG tablet 81 mg daily. Only takes on Monday, Wed, and Friday    .  atorvastatin (LIPITOR) 40 MG tablet Take 40 mg by mouth daily.    . clotrimazole-betamethasone (LOTRISONE) cream Apply 1 application topically 2 (two) times daily. 30 g 0  . Glucosamine-Chondroitin (GLUCOSAMINE CHONDR COMPLEX PO) Take by mouth daily.      Marland Kitchen levothyroxine (SYNTHROID, LEVOTHROID) 25 MCG tablet Take 1 tablet (25 mcg total) by mouth daily before breakfast. 90 tablet 1  . Multiple Vitamin (MULTIVITAMIN) tablet Take 1 tablet by mouth daily.      . polyethylene glycol (MIRALAX / GLYCOLAX) packet Take 17 g by mouth daily as needed.       No current facility-administered medications on file prior to visit.    BP 138/80 mmHg  Pulse 67  Temp(Src) 97.7 F (36.5 C) (Oral)  Resp 20  Ht 5\' 10"  (1.778 m)  Wt 220 lb (99.791 kg)  BMI  31.57 kg/m2  SpO2 96%    1. Risk factors, based on past  M,S,F history.  Patient has known CAD status post CABG  2.  Physical activities: Limited due to age and mild arthritis  3.  Depression/mood: No history depression or mood disorder  4.  Hearing: Uses bilateral hearing aids  5.  ADL's: Independent in all aspects of daily living  6.  Fall risk: Mildly increased due to age  7.  Home safety: No problems identified  8.  Height weight, and visual acuity; height and weight stable.  Decreased visual acuity, left eye  9.  Counseling: Heart healthy diet.  Encouraged  10. Lab orders based on risk factors: Laboratory update will be reviewed  11. Referral : Follow-up cardiology  12. Care plan: Low-salt, heart healthy diet.  Encouraged  13. Cognitive assessment: Alert in order with normal affect.  No cognitive dysfunction  14. Screening: We'll continue annual health examinations with screening lab.  Patient was provided with a written and personalized care plan  15. Provider List Update: Includes cardiology, ophthalmology and primary care    Review of Systems  Constitutional: Negative for fever, chills, appetite change and fatigue.  HENT: Positive for hearing loss. Negative for congestion, dental problem, ear pain, sore throat, tinnitus, trouble swallowing and voice change.   Eyes: Positive for visual disturbance. Negative for pain and discharge.  Respiratory: Negative for cough, chest tightness, wheezing and stridor.   Cardiovascular: Negative for chest pain, palpitations and leg swelling.  Gastrointestinal: Negative for nausea, vomiting, abdominal pain, diarrhea, constipation, blood in stool and abdominal distention.  Genitourinary: Negative for urgency, hematuria, flank pain, discharge, difficulty urinating and genital sores.  Musculoskeletal: Positive for arthralgias. Negative for myalgias, back pain, joint swelling, gait problem and neck stiffness.  Skin: Negative for rash.    Neurological: Negative for dizziness, syncope, speech difficulty, weakness, numbness and headaches.  Hematological: Negative for adenopathy. Does not bruise/bleed easily.  Psychiatric/Behavioral: Negative for behavioral problems and dysphoric mood. The patient is not nervous/anxious.        Objective:   Physical Exam  Constitutional: He appears well-developed and well-nourished.  HENT:  Head: Normocephalic and atraumatic.  Right Ear: External ear normal.  Left Ear: External ear normal.  Nose: Nose normal.  Mouth/Throat: Oropharynx is clear and moist.  Eyes: Conjunctivae and EOM are normal. Pupils are equal, round, and reactive to light. No scleral icterus.  Neck: Normal range of motion. Neck supple. No JVD present. No thyromegaly present.  Transmitted murmur to the carotid and supraclavicular distributions  Cardiovascular: Regular rhythm and intact distal pulses.  Exam reveals no gallop and no friction rub.  Murmur heard. Grade 3/6 systolic murmur  Sternotomy scar  Pedal pulses intact  Pulmonary/Chest: Effort normal. He has rales. He exhibits no tenderness.  Bibasal rales  Abdominal: Soft. Bowel sounds are normal. He exhibits no distension and no mass. There is no tenderness.  Genitourinary: Penis normal. Guaiac negative stool.  Uncircumcised  Musculoskeletal: Normal range of motion. He exhibits edema. He exhibits no tenderness.  Surgical scar right shoulder  Plus 1 ankle edema  Lymphadenopathy:    He has no cervical adenopathy.  Neurological: He is alert. He has normal reflexes. No cranial nerve deficit. Coordination normal.  Skin: Skin is warm and dry. No rash noted.  Stasis dermatitis involving the lower extremities  Psychiatric: He has a normal mood and affect. His behavior is normal.          Assessment & Plan:  Preventive health examination Coronary artery disease.  Will continue efforts at risk factor modification.  Continue daily aspirin and statin  therapy Dyslipidemia Osteoarthritis.  Patient is scheduled for left total knee replacement surgery pending cardiology clearance  Recheck 6 months Laboratory studies reviewed

## 2015-10-01 ENCOUNTER — Encounter: Payer: Self-pay | Admitting: Cardiovascular Disease

## 2015-10-01 ENCOUNTER — Ambulatory Visit (HOSPITAL_COMMUNITY)
Admission: RE | Admit: 2015-10-01 | Discharge: 2015-10-01 | Disposition: A | Discharge status: Home / Self Care | Payer: Medicare Other | Source: Ambulatory Visit | Attending: Cardiovascular Disease | Admitting: Cardiovascular Disease

## 2015-10-01 ENCOUNTER — Ambulatory Visit (INDEPENDENT_AMBULATORY_CARE_PROVIDER_SITE_OTHER): Payer: Medicare Other | Admitting: Cardiovascular Disease

## 2015-10-01 VITALS — BP 142/82 | HR 78 | Ht 73.0 in | Wt 224.7 lb

## 2015-10-01 DIAGNOSIS — I2510 Atherosclerotic heart disease of native coronary artery without angina pectoris: Secondary | ICD-10-CM | POA: Diagnosis not present

## 2015-10-01 DIAGNOSIS — I6529 Occlusion and stenosis of unspecified carotid artery: Secondary | ICD-10-CM

## 2015-10-01 DIAGNOSIS — R011 Cardiac murmur, unspecified: Secondary | ICD-10-CM

## 2015-10-01 DIAGNOSIS — I447 Left bundle-branch block, unspecified: Secondary | ICD-10-CM | POA: Diagnosis not present

## 2015-10-01 DIAGNOSIS — E785 Hyperlipidemia, unspecified: Secondary | ICD-10-CM | POA: Insufficient documentation

## 2015-10-01 DIAGNOSIS — I2583 Coronary atherosclerosis due to lipid rich plaque: Secondary | ICD-10-CM

## 2015-10-01 DIAGNOSIS — I6523 Occlusion and stenosis of bilateral carotid arteries: Secondary | ICD-10-CM | POA: Insufficient documentation

## 2015-10-01 DIAGNOSIS — I350 Nonrheumatic aortic (valve) stenosis: Secondary | ICD-10-CM

## 2015-10-01 HISTORY — DX: Left bundle-branch block, unspecified: I44.7

## 2015-10-01 NOTE — Patient Instructions (Signed)
Your physician has requested that you have an echocardiogram. Echocardiography is a painless test that uses sound waves to create images of your heart. It provides your doctor with information about the size and shape of your heart and how well your heart's chambers and valves are working. This procedure takes approximately one hour. There are no restrictions for this procedure.  Your physician recommends that you schedule a follow-up appointment in: 6 months with Dr. Oval Linsey

## 2015-10-01 NOTE — Progress Notes (Signed)
Cardiology Office Note   Date:  10/01/2015   ID:  Zachary Ball, DOB 16-Jan-1925, MRN [id number]  PCP:  Nyoka Cowden, MD  Cardiologist:   Sharol Harness, MD   Chief Complaint  Patient presents with  . Cardiac clearance    Knee surgery//last seen in 2012 by Dr. Hochrein//pt states no Sx.      History of Present Illness: Zachary Ball is a 82 y.o. male with CAD s/p CABG (4 vessel, 2003), mild aortic stenosis, carotid stenosis, and hyperlipidemia who presents for pre-operative risk assessment prior to knee replacement surgery.  Zachary Ball last saw Dr. Percival Spanish 03/2011. At that appointment he was referred for treadmill stress testing in order to provide a prescription for exercise and to risk stratify.   Zachary Ball had a Lexiscan Myoview in 2012 that revealed LVEF 65% and no ischemia.  Since then he has been feeling well.  He exercises twice per week at [employer].  He has no chest pain or shortness of breath with exertion or at rest.  His only limitation is his knees.  He is able to walk up a flight of stairs without chest pain but is limited by his knee pain.    Zachary Ball reports dizziness when lying down that gets better with meclizine. He denies any recent falls.  He has also noted some lower extremity edema that he attributes to "eating too much country ham."  He denies orthopnea or PND.   Past Medical History  Diagnosis Date  . AORTIC STENOSIS, MILD 04/08/2007  . ARTHROSCOPY, KNEE, HX OF 01/07/2007  . BENIGN PROSTATIC HYPERTROPHY 01/07/2007  . CAROTID STENOSIS 03/19/2009  . CORONARY ARTERY DISEASE 01/07/2007    s/p CABG 02/2002  . CORONARY ATHEROSCLEROSIS NATIVE CORONARY ARTERY 03/28/2009  . GERD 01/07/2007  . HYPERLIPIDEMIA 07/02/2007  . Osteoarth NOS-Unspec 01/07/2007  . Mild aortic stenosis   . Anal fissure   . Pneumonia     Hx of   . Cataract     surgery bilateral  . Hx of colonoscopy   . LBBB (left bundle branch block) 10/01/2015    Past Surgical  History  Procedure Laterality Date  . Coronary artery bypass graft  2003    x 4  . Shoulder surgery  1975    right  . Knee surgery      right; 1994 and 1999  . Colonoscopy       Current Outpatient Prescriptions  Medication Sig Dispense Refill  . albuterol (PROVENTIL HFA;VENTOLIN HFA) 108 (90 BASE) MCG/ACT inhaler Inhale into the lungs every 6 (six) hours as needed for wheezing or shortness of breath.    Marland Kitchen aspirin 81 MG tablet 81 mg daily. Only takes on Monday, Wed, and Friday    . atorvastatin (LIPITOR) 40 MG tablet Take 40 mg by mouth daily.    . clotrimazole-betamethasone (LOTRISONE) cream Apply 1 application topically 2 (two) times daily. 30 g 0  . Glucosamine-Chondroitin (GLUCOSAMINE CHONDR COMPLEX PO) Take by mouth daily.      Marland Kitchen levothyroxine (SYNTHROID, LEVOTHROID) 25 MCG tablet Take 1 tablet (25 mcg total) by mouth daily before breakfast. 90 tablet 1  . Multiple Vitamin (MULTIVITAMIN) tablet Take 1 tablet by mouth daily.      . polyethylene glycol (MIRALAX / GLYCOLAX) packet Take 17 g by mouth daily as needed.       No current facility-administered medications for this visit.    Allergies:   Review of patient's allergies indicates no known  allergies.    Social History:  The patient  reports that he quit smoking about 37 years ago. He has never used smokeless tobacco. He reports that he does not drink alcohol or use illicit drugs.   Family History:  The patient's family history includes Breast cancer in his mother; Colon cancer in his mother; Coronary artery disease in his father; Diabetes in his brother; Kidney disease in his brother; Stomach cancer in his mother.    ROS:  Please see the history of present illness.   Otherwise, review of systems are positive for none.   All other systems are reviewed and negative.    PHYSICAL EXAM: VS:  BP 142/82 mmHg  Pulse 78  Ht 6\' 1"  (1.854 m)  Wt 101.923 kg (224 lb 11.2 oz)  BMI 29.65 kg/m2 , BMI Body mass index is 29.65  kg/(m^2). GENERAL:  Well appearing HEENT:  Pupils equal round and reactive, fundi not visualized, oral mucosa unremarkable NECK:  No jugular venous distention, waveform within normal limits, carotid upstroke brisk and symmetric, no bruits, no thyromegaly LYMPHATICS:  No cervical adenopathy LUNGS:  Clear to auscultation bilaterally HEART:  RRR.  PMI not displaced or sustained,S1 and S2 within normal limits, no S3, no S4, no clicks, no rubs, III/VI mid-peaking systolic murmur at the LUSB. ABD:  Flat, positive bowel sounds normal in frequency in pitch, no bruits, no rebound, no guarding, no midline pulsatile mass, no hepatomegaly, no splenomegaly EXT:  2 plus pulses throughout, no edema, no cyanosis no clubbing SKIN:  No rashes no nodules NEURO:  Cranial nerves II through XII grossly intact, motor grossly intact throughout PSYCH:  Cognitively intact, oriented to person place and time   EKG:  EKG is ordered today. The ekg ordered today demonstrates sinus rhythm.  Rate 78 bpm.  R axis deviation.   Lexiscan Myoview 05/21/11: Normal stress nuclear study. No evidence of ischemia. Normal LV function. EF = 65%.  Recent Labs: 09/24/2015: ALT 13; BUN 19; Creatinine, Ser 0.96; Hemoglobin 15.0; Platelets 201.0; Potassium 4.4; Sodium 142; TSH 4.16    Lipid Panel    Component Value Date/Time   CHOL 126 09/24/2015 1000   TRIG 84.0 09/24/2015 1000   HDL 52.90 09/24/2015 1000   CHOLHDL 2 09/24/2015 1000   VLDL 16.8 09/24/2015 1000   LDLCALC 56 09/24/2015 1000      Wt Readings from Last 3 Encounters:  10/01/15 101.923 kg (224 lb 11.2 oz)  09/24/15 99.791 kg (220 lb)  06/29/15 99.791 kg (220 lb)      ASSESSMENT AND PLAN:  # Pre-surgical risk: Zachary Ball does not require any additional ischemia evaluation as he is able to achieve >4METs of activity without chest pain or shortness of breath.  He does have a LBBB that was not present on his last EKG in 2012.  Given his lack of ischemic symptoms  we will not pursue stress testing for that change.  However, his lower extremity edema and murmur require further evaluation prior to surgery to determine the severity of his valvular disease and to assess for heart failure.  It is unclear how much of his edema is due to dietary indiscretion vs. Heart failure.    # Aortic stenosis:  Murmur is consistent moderate aortic stenosis.  We will obtain an echo to evaluate.  # Edema: Zachary Ball has lower extremity edema but not JVD or crackles.  Echo as above.  Recommended compression socks and elevation of the legs.  Also avoid high salt diet.  Low threshold for starting lasix.   # CAD s/p CABG: Asymptomatic.  Continue aspirin and atorvastatin.  # Carotid stenosis: Carotid ultrasound was performed today.  Results not yet available.  # Hyperlipidemia: LDL 56 on 09/24/15.  Continue atorvastatin.  Current medicines are reviewed at length with the patient today.  The patient does not have concerns regarding medicines.  The following changes have been made:  no change  Labs/ tests ordered today include:   Orders Placed This Encounter  Procedures  . EKG 12-Lead  . ECHOCARDIOGRAM COMPLETE     Disposition:   FU with Samule Life C. Oval Linsey, MD, [facility] in 6 months   This note was written with the assistance of speech recognition software.  Please excuse any transcriptional errors.  Signed, Latrisha Coiro C. Oval Linsey, MD, [facility]  10/01/2015 4:04 PM    [facility]

## 2015-10-03 ENCOUNTER — Telehealth: Payer: Self-pay | Admitting: *Deleted

## 2015-10-03 NOTE — Telephone Encounter (Signed)
FAXED /ROUTED OFFICE NOTE FROM DR [facility]  10/01/15 GIVING CLEARANCE FOR LEFT KNEE -TOTAL KNEE REPLACEPLACEMENT.

## 2015-10-05 ENCOUNTER — Other Ambulatory Visit: Payer: Self-pay

## 2015-10-05 ENCOUNTER — Other Ambulatory Visit: Payer: Self-pay | Admitting: Orthopedic Surgery

## 2015-10-05 DIAGNOSIS — I2510 Atherosclerotic heart disease of native coronary artery without angina pectoris: Secondary | ICD-10-CM

## 2015-10-05 MED ORDER — FUROSEMIDE 20 MG PO TABS: 20.0000 mg | ORAL_TABLET | Freq: Every day | ORAL | Status: DC

## 2015-10-12 DIAGNOSIS — I2510 Atherosclerotic heart disease of native coronary artery without angina pectoris: Secondary | ICD-10-CM | POA: Diagnosis not present

## 2015-10-13 LAB — BASIC METABOLIC PANEL
BUN: 18 mg/dL (ref 7–25)
CHLORIDE: 103 mmol/L (ref 98–110)
CO2: 25 mmol/L (ref 20–31)
Calcium: 9 mg/dL (ref 8.6–10.3)
Creat: 0.91 mg/dL (ref 0.70–1.11)
Glucose, Bld: 117 mg/dL — ABNORMAL HIGH (ref 65–99)
POTASSIUM: 4.5 mmol/L (ref 3.5–5.3)
SODIUM: 138 mmol/L (ref 135–146)

## 2015-10-15 ENCOUNTER — Telehealth: Payer: Self-pay | Admitting: *Deleted

## 2015-10-15 NOTE — Telephone Encounter (Signed)
Left message to call back  

## 2015-10-15 NOTE — Telephone Encounter (Signed)
-----   Message from Skeet Latch, MD sent at 10/13/2015  6:34 AM EST ----- Kidney function and electrolytes are normal.

## 2015-10-16 NOTE — Telephone Encounter (Signed)
Spoke to wife  Instruction given Wife states patient as clearance for surgery. RN informed wife ,patient needs to come to appointment-11/05/15 11 am  to follow up - swelling  Prior to surgery 11/23/15. Wife states patient swelling has gone away since starting furosemide.  wife aware of appointment.

## 2015-10-24 DIAGNOSIS — M1712 Unilateral primary osteoarthritis, left knee: Secondary | ICD-10-CM | POA: Diagnosis not present

## 2015-11-05 ENCOUNTER — Encounter: Payer: Self-pay | Admitting: *Deleted

## 2015-11-05 ENCOUNTER — Encounter: Payer: Self-pay | Admitting: Cardiovascular Disease

## 2015-11-05 ENCOUNTER — Ambulatory Visit (INDEPENDENT_AMBULATORY_CARE_PROVIDER_SITE_OTHER): Payer: Medicare Other | Admitting: Cardiovascular Disease

## 2015-11-05 VITALS — BP 132/74 | HR 76 | Ht 72.0 in | Wt 220.6 lb

## 2015-11-05 DIAGNOSIS — I2510 Atherosclerotic heart disease of native coronary artery without angina pectoris: Secondary | ICD-10-CM | POA: Diagnosis not present

## 2015-11-05 DIAGNOSIS — I6523 Occlusion and stenosis of bilateral carotid arteries: Secondary | ICD-10-CM

## 2015-11-05 DIAGNOSIS — R609 Edema, unspecified: Secondary | ICD-10-CM | POA: Diagnosis not present

## 2015-11-05 DIAGNOSIS — I350 Nonrheumatic aortic (valve) stenosis: Secondary | ICD-10-CM | POA: Insufficient documentation

## 2015-11-05 DIAGNOSIS — I2583 Coronary atherosclerosis due to lipid rich plaque: Secondary | ICD-10-CM

## 2015-11-05 DIAGNOSIS — I5032 Chronic diastolic (congestive) heart failure: Secondary | ICD-10-CM | POA: Diagnosis not present

## 2015-11-05 DIAGNOSIS — Z79899 Other long term (current) drug therapy: Secondary | ICD-10-CM

## 2015-11-05 HISTORY — DX: Nonrheumatic aortic (valve) stenosis: I35.0

## 2015-11-05 LAB — BASIC METABOLIC PANEL
BUN: 21 mg/dL (ref 7–25)
CALCIUM: 9.2 mg/dL (ref 8.6–10.3)
CO2: 25 mmol/L (ref 20–31)
Chloride: 106 mmol/L (ref 98–110)
Creat: 0.89 mg/dL (ref 0.70–1.11)
GLUCOSE: 100 mg/dL — AB (ref 65–99)
Potassium: 4.7 mmol/L (ref 3.5–5.3)
SODIUM: 140 mmol/L (ref 135–146)

## 2015-11-05 MED ORDER — FUROSEMIDE 20 MG PO TABS: 20.0000 mg | ORAL_TABLET | Freq: Every day | ORAL | Status: DC

## 2015-11-05 NOTE — Patient Instructions (Addendum)
Medication Instructions:  Your physician recommends that you continue on your current medications as directed. Please refer to the Current Medication list given to you today.  Labwork: BMET DOWNSTAIRS ON THE FIRST FLOOR AT [facility]  Testing/Procedures: NONE  Follow-Up: Your physician wants you to follow-up in: [place] will receive a reminder letter in the mail two months in advance. If you don't receive a letter, please call our office to schedule the follow-up appointment.  Any Other Special Instructions Will Be Listed Below (If Applicable). YOU ARE CLEARED FROM CARDIAC STANDPOINT FOR KNEE SURGERY  If you need a refill on your cardiac medications before your next appointment, please call your pharmacy.

## 2015-11-05 NOTE — Progress Notes (Signed)
Cardiology Office Note   Date:  11/05/2015   ID:  Zachary Ball, DOB 1925-04-25, MRN [id number]  PCP:  Nyoka Cowden, MD  Cardiologist:   Sharol Harness, MD   Chief Complaint  Patient presents with  . New Patient (Initial Visit)     no chest pain, no shortness of breath, edema-controled, occassional cramping in legs, occassional lightheadedness or dizziness,no fatigue  . Medical Clearance    upcoming knee surgery with Dr Hart Robinsons      Patient ID: Zachary Ball is a 82 y.o. male with CAD s/p CABG (4 vessel, 2003), moderate aortic stenosis, carotid stenosis, and hyperlipidemia who presents for pre-operative risk assessment prior to knee replacement surgery.    Interval History 11/05/15: After his last appointment Zachary Ball underwent echocardiography that revealed moderate aortic stenosis and severe LVH.  He was started on lasix 20 mg po daily.  Carotid ultrasound revealed mild plaque bilaterally.  He continues to do well. Zachary Ball denies chest pain, shortness of breath, orthopnea or PND.  He continues to have mild lower extremity edema that has improved significantly since starting lasix.  He has knee pain and anticipates knee surgery with Dr. Hart Robinsons soon.  History of Present Illness 10/01/15: Zachary Ball last saw Dr. Percival Spanish 03/2011. At that appointment he was referred for treadmill stress testing in order to provide a prescription for exercise and to risk stratify.   Zachary Ball had a Lexiscan Myoview in 2012 that revealed LVEF 65% and no ischemia.  Since then he has been feeling well.  He exercises twice per week at [employer].  He has no chest pain or shortness of breath with exertion or at rest.  His only limitation is his knees.  He is able to walk up a flight of stairs without chest pain but is limited by his knee pain.    Zachary Ball reports dizziness when lying down  that gets better with meclizine. He denies any recent falls.  He has also noted some lower extremity edema that he attributes to "eating too much country ham."  He denies orthopnea or PND.   Past Medical History  Diagnosis Date  . AORTIC STENOSIS, MILD 04/08/2007  . ARTHROSCOPY, KNEE, HX OF 01/07/2007  . BENIGN PROSTATIC HYPERTROPHY 01/07/2007  . CAROTID STENOSIS 03/19/2009  . CORONARY ARTERY DISEASE 01/07/2007    s/p CABG 02/2002  . CORONARY ATHEROSCLEROSIS NATIVE CORONARY ARTERY 03/28/2009  . GERD 01/07/2007  . HYPERLIPIDEMIA 07/02/2007  . Osteoarth NOS-Unspec 01/07/2007  . Mild aortic stenosis   . Anal fissure   . Pneumonia     Hx of   . Cataract     surgery bilateral  . Hx of colonoscopy   . LBBB (left bundle branch block) 10/01/2015  . Moderate aortic stenosis 11/05/2015  . Aortic stenosis, moderate 11/05/2015    Past Surgical History  Procedure Laterality Date  . Coronary artery bypass graft  2003    x 4  . Shoulder surgery  1975    right  . Knee surgery      right; 1994 and 1999  . Colonoscopy       Current Outpatient Prescriptions  Medication Sig Dispense Refill  . albuterol (PROVENTIL HFA;VENTOLIN HFA) 108 (90 BASE) MCG/ACT inhaler Inhale into the lungs every 6 (six) hours as needed for wheezing or shortness of breath.    Marland Kitchen aspirin 81 MG tablet 81 mg daily. Only takes on Monday, Wed, and Friday    .  atorvastatin (LIPITOR) 40 MG tablet Take 40 mg by mouth daily.    . clotrimazole-betamethasone (LOTRISONE) cream Apply 1 application topically 2 (two) times daily. 30 g 0  . furosemide (LASIX) 20 MG tablet Take 1 tablet (20 mg total) by mouth daily. 90 tablet 3  . Glucosamine-Chondroitin (GLUCOSAMINE CHONDR COMPLEX PO) Take by mouth daily.      Marland Kitchen levothyroxine (SYNTHROID, LEVOTHROID) 25 MCG tablet Take 1 tablet (25 mcg total) by mouth daily before breakfast. 90 tablet 1  . Multiple Vitamin (MULTIVITAMIN) tablet Take 1 tablet by mouth daily.      . polyethylene glycol (MIRALAX /  GLYCOLAX) packet Take 17 g by mouth daily as needed.      . traMADol (ULTRAM) 50 MG tablet Take 50 mg by mouth every 6 (six) hours as needed. for pain  0   No current facility-administered medications for this visit.    Allergies:   Review of patient's allergies indicates no known allergies.    Social History:  The patient  reports that he quit smoking about 37 years ago. He has never used smokeless tobacco. He reports that he does not drink alcohol or use illicit drugs.   Family History:  The patient's family history includes Breast cancer in his mother; Colon cancer in his mother; Coronary artery disease in his father; Diabetes in his brother; Kidney disease in his brother; Stomach cancer in his mother.    ROS:  Please see the history of present illness.  Otherwise, review of systems are positive for leg cramping.   All other systems are reviewed and negative.    PHYSICAL EXAM: VS:  BP 132/74 mmHg  Pulse 76  Ht 6' (1.829 m)  Wt 100.046 kg (220 lb 9 oz)  BMI 29.91 kg/m2 , BMI Body mass index is 29.91 kg/(m^2). GENERAL:  Well appearing HEENT:  Pupils equal round and reactive, fundi not visualized, oral mucosa unremarkable NECK:  No jugular venous distention, waveform within normal limits, carotid upstroke brisk and symmetric, no bruits LYMPHATICS:  No cervical adenopathy LUNGS:  Clear to auscultation bilaterally HEART:  RRR.  PMI not displaced or sustained,S1 and S2 within normal limits, no S3, no S4, no clicks, no rubs, III/VI mid-peaking systolic murmur at the LUSB. ABD:  Flat, positive bowel sounds normal in frequency in pitch, no bruits, no rebound, no guarding, no midline pulsatile mass, no hepatomegaly, no splenomegaly EXT:  2 plus pulses throughout, 1+ pitting edema to bilateral lower tibia, no cyanosis no clubbing SKIN:  No rashes no nodules NEURO:  Cranial nerves II through XII grossly intact, motor grossly intact throughout PSYCH:  Cognitively intact, oriented to person  place and time   EKG:  EKG is ordered today. The ekg ordered today demonstrates sinus rhythm.  Rate 76 bpm.  Non-specific ST-T changes.  Echo 10/01/15: Study Conclusions  - Left ventricle: Abnormal septal motion The cavity size was mildly  dilated. Wall thickness was increased in a pattern of severe LVH.  Systolic function was normal. The estimated ejection fraction was  in the range of 50% to 55%. - Aortic valve: There was moderate to severe stenosis. There was  mild regurgitation. Valve area (VTI): 1.02 cm^2. Valve area  (Vmax): 0.95 cm^2. Valve area (Vmean): 0.92 cm^2. - Mitral valve: There was mild regurgitation. - Left atrium: The atrium was mildly dilated. - Atrial septum: No defect or patent foramen ovale was identified. Grade 1 diastolic dysfunction  Lexiscan Myoview 05/21/11: Normal stress nuclear study. No evidence of ischemia.  Normal LV function. EF = 65%.  Recent Labs: 09/24/2015: ALT 13; Hemoglobin 15.0; Platelets 201.0; TSH 4.16 10/12/2015: BUN 18; Creat 0.91; Potassium 4.5; Sodium 138    Lipid Panel    Component Value Date/Time   CHOL 126 09/24/2015 1000   TRIG 84.0 09/24/2015 1000   HDL 52.90 09/24/2015 1000   CHOLHDL 2 09/24/2015 1000   VLDL 16.8 09/24/2015 1000   LDLCALC 56 09/24/2015 1000    Wt Readings from Last 3 Encounters:  11/05/15 100.046 kg (220 lb 9 oz)  10/01/15 101.923 kg (224 lb 11.2 oz)  09/24/15 99.791 kg (220 lb)    ASSESSMENT AND PLAN:  # Pre-surgical risk: Zachary Ball does not require any additional ischemia evaluation as he is able to achieve >4METs of activity without chest pain or shortness of breath.  Echo reveals moderate aortic stenosis.  This does not significantly affect his surgical risk.  However, I would advise close hemodynamic monitoring and avoidance of hypotension.  Limit excess IV fluid if possible.  # Aortic stenosis:  Moderate aortic stenosis.  He remains asymptomatic.  We will continue to monitor for now.  #  Edema: Improved with lasix.  Echo revealed normal systolic function and severe LVH.  Diastolic function was not reported.  On my independent review of his echo, it appears that he has grade 1 diastolic dysfunction.  He also has severe LVH but there was no evidence of SAM or an intracavitary gradient.   # CAD s/p CABG: Asymptomatic.  Continue aspirin and atorvastatin.  # Carotid stenosis: Mild carotid disease bilaterally. Continue aspirin and atorvastatin.   # Hyperlipidemia: LDL 56 on 09/24/15.  Continue atorvastatin.  Current medicines are reviewed at length with the patient today.  The patient does not have concerns regarding medicines.  The following changes have been made:  no change  Labs/ tests ordered today include:   Orders Placed This Encounter  Procedures  . Basic metabolic panel  . EKG 12-Lead     Disposition:   FU with Rhyder Bratz C. Oval Linsey, MD, [facility] in 6 months   This note was written with the assistance of speech recognition software.  Please excuse any transcriptional errors.  Signed, Kurstyn Larios C. Oval Linsey, MD, [facility]  11/05/2015 12:28 PM    [place]

## 2015-11-12 NOTE — H&P (Signed)
TOTAL KNEE ADMISSION H&P  Patient is being admitted for left total knee arthroplasty.  Subjective:  Chief Complaint:left knee pain.  HPI: Zachary Ball, 82 y.o. male, has a history of pain and functional disability in the left knee due to arthritis and has failed non-surgical conservative treatments for greater than 12 weeks to includeNSAID's and/or analgesics, corticosteriod injections, viscosupplementation injections, supervised PT with diminished ADL's post treatment, weight reduction as appropriate and activity modification.  Onset of symptoms was gradual, starting 7 years ago with gradually worsening course since that time. The patient noted no past surgery on the left knee(s).  Patient currently rates pain in the left knee(s) at 8 out of 10 with activity. Patient has worsening of pain with activity and weight bearing, pain that interferes with activities of daily living, pain with passive range of motion and joint swelling.  Patient has evidence of subchondral sclerosis, periarticular osteophytes and joint space narrowing by imaging studies. This patient has had osteoarthritis. There is no active infection.  Patient Active Problem List   Diagnosis Date Noted  . Moderate aortic stenosis 11/05/2015  . Aortic stenosis, moderate 11/05/2015  . LBBB (left bundle branch block) 10/01/2015  . Hypothyroidism 03/20/2015  . CORONARY ATHEROSCLEROSIS NATIVE CORONARY ARTERY 03/28/2009  . Dyslipidemia 07/02/2007  . Coronary atherosclerosis 01/07/2007  . GERD 01/07/2007  . BENIGN PROSTATIC HYPERTROPHY 01/07/2007  . Osteoarthritis 01/07/2007  . ARTHROSCOPY, KNEE, HX OF 01/07/2007   Past Medical History  Diagnosis Date  . AORTIC STENOSIS, MILD 04/08/2007  . ARTHROSCOPY, KNEE, HX OF 01/07/2007  . BENIGN PROSTATIC HYPERTROPHY 01/07/2007  . CAROTID STENOSIS 03/19/2009  . CORONARY ARTERY DISEASE 01/07/2007    s/p CABG 02/2002  . CORONARY ATHEROSCLEROSIS NATIVE CORONARY ARTERY 03/28/2009  . GERD 01/07/2007   . HYPERLIPIDEMIA 07/02/2007  . Osteoarth NOS-Unspec 01/07/2007  . Mild aortic stenosis   . Anal fissure   . Pneumonia     Hx of   . Cataract     surgery bilateral  . Hx of colonoscopy   . LBBB (left bundle branch block) 10/01/2015  . Moderate aortic stenosis 11/05/2015  . Aortic stenosis, moderate 11/05/2015    Past Surgical History  Procedure Laterality Date  . Coronary artery bypass graft  2003    x 4  . Shoulder surgery  1975    right  . Knee surgery      right; 1994 and 1999  . Colonoscopy      No prescriptions prior to admission   No Known Allergies  Social History  Substance Use Topics  . Smoking status: Former Smoker    Quit date: 08/25/1978  . Smokeless tobacco: Never Used     Comment: 90 pack year history, quit 30 years ago  . Alcohol Use: No    Family History  Problem Relation Age of Onset  . Coronary artery disease Father   . Breast cancer Mother   . Stomach cancer Mother   . Colon cancer Mother   . Diabetes Brother   . Kidney disease Brother      Review of Systems  Constitutional: Negative.   HENT: Negative.   Eyes: Negative.   Respiratory: Negative.   Cardiovascular: Negative.   Gastrointestinal: Negative.   Genitourinary: Negative.   Musculoskeletal: Positive for joint pain.  Skin: Negative.   Neurological: Negative.   Endo/Heme/Allergies: Negative.   Psychiatric/Behavioral: Negative.     Objective:  Physical Exam  Constitutional: He is oriented to person, place, and time. He appears well-developed.  HENT:  Head: Normocephalic.  Eyes: EOM are normal.  Neck: Normal range of motion.  Cardiovascular: Normal rate, regular rhythm and intact distal pulses.   Murmur heard. Respiratory: Effort normal and breath sounds normal.  GI: Soft. Bowel sounds are normal.  Genitourinary:  Deferred  Musculoskeletal:  Left knee pain with rom. Knee is stable. LLE grossly n/v intact.  Neurological: He is alert and oriented to person, place, and time. He  has normal reflexes.  Skin: Skin is warm and dry.  Psychiatric: His behavior is normal.    Vital signs in last 24 hours:    Labs:   Estimated body mass index is 29.65 kg/(m^2) as calculated from the following:   Height as of 10/01/15: 6\' 1"  (1.854 m).   Weight as of 10/01/15: 101.923 kg (224 lb 11.2 oz).   Imaging Review Plain radiographs demonstrate severe degenerative joint disease of the left knee(s). The overall alignment ismild varus. The bone quality appears to be good for age and reported activity level.  Assessment/Plan:  End stage arthritis, left knee   The patient history, physical examination, clinical judgment of the provider and imaging studies are consistent with end stage degenerative joint disease of the left knee(s) and total knee arthroplasty is deemed medically necessary. The treatment options including medical management, injection therapy arthroscopy and arthroplasty were discussed at length. The risks and benefits of total knee arthroplasty were presented and reviewed. The risks due to aseptic loosening, infection, stiffness, patella tracking problems, thromboembolic complications and other imponderables were discussed. The patient acknowledged the explanation, agreed to proceed with the plan and consent was signed. Patient is being admitted for inpatient treatment for surgery, pain control, PT, OT, prophylactic antibiotics, VTE prophylaxis, progressive ambulation and ADL's and discharge planning. The patient is planning to be discharged home with home health services.  Will not use tranexamic acid. Contraindications and adverse affects of Tranexamic acid discussed in detail. Patient has history of Bypass and CAD.

## 2015-11-13 ENCOUNTER — Other Ambulatory Visit (HOSPITAL_COMMUNITY): Payer: PRIVATE HEALTH INSURANCE

## 2015-11-14 ENCOUNTER — Ambulatory Visit (INDEPENDENT_AMBULATORY_CARE_PROVIDER_SITE_OTHER): Payer: Medicare Other | Admitting: Podiatry

## 2015-11-14 ENCOUNTER — Encounter: Payer: Self-pay | Admitting: Podiatry

## 2015-11-14 DIAGNOSIS — B351 Tinea unguium: Secondary | ICD-10-CM

## 2015-11-14 DIAGNOSIS — M79676 Pain in unspecified toe(s): Secondary | ICD-10-CM

## 2015-11-14 NOTE — Progress Notes (Signed)
Patient ID: Zachary Ball, male   DOB: 07-Jul-1925, 82 y.o.   MRN: [id number] Complaint:  Visit Type: Patient returns to my office for continued preventative foot care services. Complaint: Patient states" my nails have grown long and thick and become painful to walk and wear shoe. He presents for preventative foot care services. No changes to ROS.  He says he is scheduled for knee surgery soon..  Podiatric Exam: Vascular: dorsalis pedis and posterior tibial pulses are palpable bilateral. Capillary return is immediate. Temperature gradient is WNL. Skin turgor WNL  Sensorium: Normal Semmes Weinstein monofilament test. Normal tactile sensation bilaterally. Nail Exam: Pt has thick disfigured discolored nails with subungual debris noted bilateral entire nail hallux through fifth toenails Ulcer Exam: There is no evidence of ulcer or pre-ulcerative changes or infection. Orthopedic Exam: Muscle tone and strength are WNL. No limitations in general ROM. No crepitus or effusions noted. Foot type and digits show no abnormalities. Bony prominences are unremarkable. Skin: No Porokeratosis. No infection or ulcers  Diagnosis:  Tinea unguium, Pain in right toe, pain in left toes  Treatment & Plan Procedures and Treatment: Consent by patient was obtained for treatment procedures. The patient understood the discussion of treatment and procedures well. All questions were answered thoroughly reviewed. Debridement of mycotic and hypertrophic toenails, 1 through 5 bilateral and clearing of subungual debris. No ulceration, no infection noted.  Return Visit-Office Procedure: Patient instructed to return to the office for a follow up visit 3 months for continued evaluation and treatment.   Gardiner Barefoot DPM

## 2015-11-15 ENCOUNTER — Encounter (HOSPITAL_COMMUNITY)
Admission: RE | Admit: 2015-11-15 | Discharge: 2015-11-15 | Disposition: A | Discharge status: Home / Self Care | Payer: Medicare Other | Source: Ambulatory Visit | Attending: Specialist | Admitting: Specialist

## 2015-11-15 ENCOUNTER — Encounter (HOSPITAL_COMMUNITY): Payer: Self-pay

## 2015-11-15 DIAGNOSIS — Z01812 Encounter for preprocedural laboratory examination: Secondary | ICD-10-CM | POA: Insufficient documentation

## 2015-11-15 DIAGNOSIS — M1712 Unilateral primary osteoarthritis, left knee: Secondary | ICD-10-CM | POA: Diagnosis not present

## 2015-11-15 HISTORY — DX: Hypothyroidism, unspecified: E03.9

## 2015-11-15 LAB — CBC
HEMATOCRIT: 43 % (ref 39.0–52.0)
HEMOGLOBIN: 14.6 g/dL (ref 13.0–17.0)
MCH: 31 pg (ref 26.0–34.0)
MCHC: 34 g/dL (ref 30.0–36.0)
MCV: 91.3 fL (ref 78.0–100.0)
Platelets: 197 10*3/uL (ref 150–400)
RBC: 4.71 MIL/uL (ref 4.22–5.81)
RDW: 13.7 % (ref 11.5–15.5)
WBC: 7.7 10*3/uL (ref 4.0–10.5)

## 2015-11-15 LAB — SURGICAL PCR SCREEN
MRSA, PCR: NEGATIVE
STAPHYLOCOCCUS AUREUS: NEGATIVE

## 2015-11-15 LAB — URINALYSIS, ROUTINE W REFLEX MICROSCOPIC
Bilirubin Urine: NEGATIVE
Glucose, UA: NEGATIVE mg/dL
Hgb urine dipstick: NEGATIVE
Ketones, ur: NEGATIVE mg/dL
LEUKOCYTES UA: NEGATIVE
NITRITE: NEGATIVE
PH: 5 (ref 5.0–8.0)
Protein, ur: NEGATIVE mg/dL
SPECIFIC GRAVITY, URINE: 1.015 (ref 1.005–1.030)

## 2015-11-15 LAB — APTT: aPTT: 34 seconds (ref 24–37)

## 2015-11-15 LAB — PROTIME-INR
INR: 1.11 (ref 0.00–1.49)
PROTHROMBIN TIME: 14 s (ref 11.6–15.2)

## 2015-11-15 LAB — ABO/RH: ABO/RH(D): O POS

## 2015-11-15 NOTE — Patient Instructions (Addendum)
Zachary Ball  11/15/2015   Your procedure is scheduled [id number] 11/23/2015  Report to [facility] Main  Entrance take [name]  elevators to 3rd floor to  [place] at  1100 AM.  Call this number if you have problems the morning of surgery [phone]   Remember: ONLY 1 PERSON MAY GO WITH YOU TO SHORT STAY TO GET  READY MORNING OF [name].   Do not eat food  :After Midnight.MAY HAVE CLEAR LIQUIDS FROM MIDNIGHT UP UNTIL 0700 AM THEN NOTHING UNTIL AFTER SURGERY!     Take these medicines the morning of surgery with A SIP OF WATER:  LEVOTHYROXINE                                 You may not have any metal on your body including hair pins and              piercings  Do not wear jewelry, make-up, lotions, powders or perfumes, deodorant                           Men may shave face and neck.   Do not bring valuables to the hospital. [place].  Contacts, dentures or bridgework may not be worn into surgery.  Leave suitcase in the car. After surgery it may be brought to your room.                  Please read over the following fact sheets you were given: _____________________________________________________________________             [facility] - Preparing for Surgery Before surgery, you can play an important role.  Because skin is not sterile, your skin needs to be as free of germs as possible.  You can reduce the number of germs on your skin by washing with CHG (chlorahexidine gluconate) soap before surgery.  CHG is an antiseptic cleaner which kills germs and bonds with the skin to continue killing germs even after washing. Please DO NOT use if you have an allergy to CHG or antibacterial soaps.  If your skin becomes reddened/irritated stop using the CHG and inform your nurse when you arrive at Short Stay. Do not shave (including legs and underarms) for at least 48 hours prior to the first CHG  shower.  You may shave your face/neck. Please follow these instructions carefully:  1.  Shower with CHG Soap the night before surgery and the  morning of Surgery.  2.  If you choose to wash your hair, wash your hair first as usual with your  normal  shampoo.  3.  After you shampoo, rinse your hair and body thoroughly to remove the  shampoo.                           4.  Use CHG as you would any other liquid soap.  You can apply chg directly  to the skin and wash                       Gently with a scrungie or clean washcloth.  5.  Apply the  CHG Soap to your body ONLY FROM THE NECK DOWN.   Do not use on face/ open                           Wound or open sores. Avoid contact with eyes, ears mouth and genitals (private parts).                       Wash face,  Genitals (private parts) with your normal soap.             6.  Wash thoroughly, paying special attention to the area where your surgery  will be performed.  7.  Thoroughly rinse your body with warm water from the neck down.  8.  DO NOT shower/wash with your normal soap after using and rinsing off  the CHG Soap.                9.  Pat yourself dry with a clean towel.            10.  Wear clean pajamas.            11.  Place clean sheets on your bed the night of your first shower and do not  sleep with pets. Day of Surgery : Do not apply any lotions/deodorants the morning of surgery.  Please wear clean clothes to the hospital/surgery center.  FAILURE TO FOLLOW THESE INSTRUCTIONS MAY RESULT IN THE CANCELLATION OF YOUR SURGERY PATIENT SIGNATURE_________________________________  NURSE SIGNATURE__________________________________  ________________________________________________________________________   Zachary Ball  An incentive spirometer is a tool that can help keep your lungs clear and active. This tool measures how well you are filling your lungs with each breath. Taking long deep breaths may help reverse or decrease the chance  of developing breathing (pulmonary) problems (especially infection) following:  A long period of time when you are unable to move or be active. BEFORE THE PROCEDURE   If the spirometer includes an indicator to show your best effort, your nurse or respiratory therapist will set it to a desired goal.  If possible, sit up straight or lean slightly forward. Try not to slouch.  Hold the incentive spirometer in an upright position. INSTRUCTIONS FOR USE   Sit on the edge of your bed if possible, or sit up as far as you can in bed or on a chair.  Hold the incentive spirometer in an upright position.  Breathe out normally.  Place the mouthpiece in your mouth and seal your lips tightly around it.  Breathe in slowly and as deeply as possible, raising the piston or the ball toward the top of the column.  Hold your breath for 3-5 seconds or for as long as possible. Allow the piston or ball to fall to the bottom of the column.  Remove the mouthpiece from your mouth and breathe out normally.  Rest for a few seconds and repeat Steps 1 through 7 at least 10 times every 1-2 hours when you are awake. Take your time and take a few normal breaths between deep breaths.  The spirometer may include an indicator to show your best effort. Use the indicator as a goal to work toward during each repetition.  After each set of 10 deep breaths, practice coughing to be sure your lungs are clear. If you have an incision (the cut made at the time of surgery), support your incision when coughing by placing a pillow or rolled up  towels firmly against it. Once you are able to get out of bed, walk around indoors and cough well. You may stop using the incentive spirometer when instructed by your caregiver.  RISKS AND COMPLICATIONS  Take your time so you do not get dizzy or light-headed.  If you are in pain, you may need to take or ask for pain medication before doing incentive spirometry. It is harder to take a deep  breath if you are having pain. AFTER USE  Rest and breathe slowly and easily.  It can be helpful to keep track of a log of your progress. Your caregiver can provide you with a simple table to help with this. If you are using the spirometer at home, follow these instructions: [place] IF:   You are having difficultly using the spirometer.  You have trouble using the spirometer as often as instructed.  Your pain medication is not giving enough relief while using the spirometer.  You develop fever of 100.5 F (38.1 C) or higher. SEEK IMMEDIATE MEDICAL CARE IF:   You cough up bloody sputum that had not been present before.  You develop fever of 102 F (38.9 C) or greater.  You develop worsening pain at or near the incision site. MAKE SURE YOU:   Understand these instructions.  Will watch your condition.  Will get help right away if you are not doing well or get worse. Document Released: 12/22/2006 Document Revised: 11/03/2011 Document Reviewed: 02/22/2007 ExitCare Patient Information 2014 ExitCare, [place].   ________________________________________________________________________  WHAT IS A BLOOD TRANSFUSION? Blood Transfusion Information  A transfusion is the replacement of blood or some of its parts. Blood is made up of multiple cells which provide different functions.  Red blood cells carry oxygen and are used for blood loss replacement.  White blood cells fight against infection.  Platelets control bleeding.  Plasma helps clot blood.  Other blood products are available for specialized needs, such as hemophilia or other clotting disorders. BEFORE THE TRANSFUSION  Who gives blood for transfusions?   Healthy volunteers who are fully evaluated to make sure their blood is safe. This is blood bank blood. Transfusion therapy is the safest it has ever been in the practice of medicine. Before blood is taken from a donor, a complete history is taken to make sure  that person has no history of diseases nor engages in risky social behavior (examples are intravenous drug use or sexual activity with multiple partners). The donor's travel history is screened to minimize risk of transmitting infections, such as malaria. The donated blood is tested for signs of infectious diseases, such as HIV and hepatitis. The blood is then tested to be sure it is compatible with you in order to minimize the chance of a transfusion reaction. If you or a relative donates blood, this is often done in anticipation of surgery and is not appropriate for emergency situations. It takes many days to process the donated blood. RISKS AND COMPLICATIONS Although transfusion therapy is very safe and saves many lives, the main dangers of transfusion include:   Getting an infectious disease.  Developing a transfusion reaction. This is an allergic reaction to something in the blood you were given. Every precaution is taken to prevent this. The decision to have a blood transfusion has been considered carefully by your caregiver before blood is given. Blood is not given unless the benefits outweigh the risks. AFTER THE TRANSFUSION  Right after receiving a blood transfusion, you will usually feel much  better and more energetic. This is especially true if your red blood cells have gotten low (anemic). The transfusion raises the level of the red blood cells which carry oxygen, and this usually causes an energy increase.  The nurse administering the transfusion will monitor you carefully for complications. HOME CARE INSTRUCTIONS  No special instructions are needed after a transfusion. You may find your energy is better. Speak with your caregiver about any limitations on activity for underlying diseases you may have. SEEK MEDICAL CARE IF:   Your condition is not improving after your transfusion.  You develop redness or irritation at the intravenous (IV) site. SEEK IMMEDIATE MEDICAL CARE IF:  Any of  the following symptoms occur over the next 12 hours:  Shaking chills.  You have a temperature by mouth above 102 F (38.9 C), not controlled by medicine.  Chest, back, or muscle pain.  People around you feel you are not acting correctly or are confused.  Shortness of breath or difficulty breathing.  Dizziness and fainting.  You get a rash or develop hives.  You have a decrease in urine output.  Your urine turns a dark color or changes to pink, red, or brown. Any of the following symptoms occur over the next 10 days:  You have a temperature by mouth above 102 F (38.9 C), not controlled by medicine.  Shortness of breath.  Weakness after normal activity.  The white part of the eye turns yellow (jaundice).  You have a decrease in the amount of urine or are urinating less often.  Your urine turns a dark color or changes to pink, red, or brown. Document Released: 08/08/2000 Document Revised: 11/03/2011 Document Reviewed: 03/27/2008 ExitCare Patient Information 2014 ExitCare, [place].  _______________________________________________________________________   CLEAR LIQUID DIET   Foods Allowed                                                                     Foods Excluded  Coffee and tea, regular and decaf                             liquids that you cannot  Plain Jell-O in any flavor                                             see through such as: Fruit ices (not with fruit pulp)                                     milk, soups, orange juice  Iced Popsicles                                    All solid food Carbonated beverages, regular and diet                                    Cranberry, grape and apple  juices Sports drinks like Gatorade Lightly seasoned clear broth or consume(fat free) Sugar, honey syrup  Sample Menu Breakfast                                Lunch                                     Supper Cranberry juice                    Beef broth                             Chicken broth Jell-O                                     Grape juice                           Apple juice Coffee or tea                        Jell-O                                      Popsicle                                                Coffee or tea                        Coffee or tea  _____________________________________________________________________

## 2015-11-15 NOTE — Progress Notes (Addendum)
BMP results in epic  EKG epic 11/05/2015 ECHO epic 10/01/2015 also on chart  Clearance per Dr [place]/cardiology/epic 11/05/2015 also in chart; also OV note in epic and on chart from 10/01/2015 Clearance per Dr Inda Merlin per chart 07/09/2015

## 2015-11-23 ENCOUNTER — Encounter (HOSPITAL_COMMUNITY)
Admission: RE | Disposition: A | Discharge status: Home Health | Payer: Self-pay | Source: Ambulatory Visit | Attending: Specialist

## 2015-11-23 ENCOUNTER — Inpatient Hospital Stay (HOSPITAL_COMMUNITY): Payer: Medicare Other | Admitting: Anesthesiology

## 2015-11-23 ENCOUNTER — Encounter (HOSPITAL_COMMUNITY): Payer: Self-pay | Admitting: Orthopedic Surgery

## 2015-11-23 ENCOUNTER — Inpatient Hospital Stay (HOSPITAL_COMMUNITY)
Admission: RE | Admit: 2015-11-23 | Discharge: 2015-11-25 | DRG: 470 | Disposition: A | Discharge status: Home Health | Payer: Medicare Other | Source: Ambulatory Visit | Attending: Specialist | Admitting: Specialist

## 2015-11-23 DIAGNOSIS — M179 Osteoarthritis of knee, unspecified: Secondary | ICD-10-CM | POA: Diagnosis not present

## 2015-11-23 DIAGNOSIS — I739 Peripheral vascular disease, unspecified: Secondary | ICD-10-CM | POA: Diagnosis present

## 2015-11-23 DIAGNOSIS — I350 Nonrheumatic aortic (valve) stenosis: Secondary | ICD-10-CM | POA: Diagnosis present

## 2015-11-23 DIAGNOSIS — E039 Hypothyroidism, unspecified: Secondary | ICD-10-CM | POA: Diagnosis present

## 2015-11-23 DIAGNOSIS — I2510 Atherosclerotic heart disease of native coronary artery without angina pectoris: Secondary | ICD-10-CM | POA: Diagnosis present

## 2015-11-23 DIAGNOSIS — Z951 Presence of aortocoronary bypass graft: Secondary | ICD-10-CM | POA: Diagnosis not present

## 2015-11-23 DIAGNOSIS — M25569 Pain in unspecified knee: Secondary | ICD-10-CM | POA: Diagnosis not present

## 2015-11-23 DIAGNOSIS — M1712 Unilateral primary osteoarthritis, left knee: Secondary | ICD-10-CM

## 2015-11-23 DIAGNOSIS — Z87891 Personal history of nicotine dependence: Secondary | ICD-10-CM | POA: Diagnosis not present

## 2015-11-23 DIAGNOSIS — K219 Gastro-esophageal reflux disease without esophagitis: Secondary | ICD-10-CM | POA: Diagnosis present

## 2015-11-23 DIAGNOSIS — M25562 Pain in left knee: Secondary | ICD-10-CM | POA: Diagnosis not present

## 2015-11-23 DIAGNOSIS — Z01812 Encounter for preprocedural laboratory examination: Secondary | ICD-10-CM | POA: Diagnosis not present

## 2015-11-23 DIAGNOSIS — Z96659 Presence of unspecified artificial knee joint: Secondary | ICD-10-CM

## 2015-11-23 HISTORY — PX: TOTAL KNEE ARTHROPLASTY: SHX125

## 2015-11-23 LAB — TYPE AND SCREEN
ABO/RH(D): O POS
Antibody Screen: NEGATIVE

## 2015-11-23 SURGERY — ARTHROPLASTY, KNEE, TOTAL
Anesthesia: General | Site: Knee | Laterality: Left

## 2015-11-23 MED ORDER — DIPHENHYDRAMINE HCL 12.5 MG/5ML PO ELIX: 12.5000 mg | ORAL_SOLUTION | ORAL | Status: DC | PRN

## 2015-11-23 MED ORDER — LACTATED RINGERS IV SOLN
INTRAVENOUS | Status: DC | PRN
  Administered 2015-11-23: 13:00:00 via INTRAVENOUS

## 2015-11-23 MED ORDER — METOCLOPRAMIDE HCL 5 MG/ML IJ SOLN: 5.0000 mg | Freq: Three times a day (TID) | INTRAMUSCULAR | Status: DC | PRN

## 2015-11-23 MED ORDER — BACLOFEN 10 MG PO TABS: 10.0000 mg | ORAL_TABLET | Freq: Three times a day (TID) | ORAL | Status: DC

## 2015-11-23 MED ORDER — HYDROMORPHONE HCL 1 MG/ML IJ SOLN
INTRAMUSCULAR | Status: AC
Start: 2015-11-23 — End: 2015-11-23
  Filled 2015-11-23: qty 1

## 2015-11-23 MED ORDER — ONDANSETRON HCL 4 MG/2ML IJ SOLN
INTRAMUSCULAR | Status: DC | PRN
  Administered 2015-11-23: 4 mg via INTRAVENOUS

## 2015-11-23 MED ORDER — ENOXAPARIN SODIUM 30 MG/0.3ML ~~LOC~~ SOLN
30.0000 mg | Freq: Two times a day (BID) | SUBCUTANEOUS | Status: DC
  Administered 2015-11-24 – 2015-11-25 (×3): 30 mg via SUBCUTANEOUS
  Filled 2015-11-23 (×5): qty 0.3

## 2015-11-23 MED ORDER — FUROSEMIDE 20 MG PO TABS
20.0000 mg | ORAL_TABLET | Freq: Every day | ORAL | Status: DC
  Administered 2015-11-23 – 2015-11-25 (×3): 20 mg via ORAL
  Filled 2015-11-23 (×3): qty 1

## 2015-11-23 MED ORDER — METHOCARBAMOL 500 MG PO TABS
500.0000 mg | ORAL_TABLET | Freq: Four times a day (QID) | ORAL | Status: DC | PRN
  Administered 2015-11-24 – 2015-11-25 (×3): 500 mg via ORAL
  Filled 2015-11-23 (×4): qty 1

## 2015-11-23 MED ORDER — SODIUM CHLORIDE 0.9 % IJ SOLN
INTRAMUSCULAR | Status: AC
  Filled 2015-11-23: qty 50

## 2015-11-23 MED ORDER — FENTANYL CITRATE (PF) 250 MCG/5ML IJ SOLN
INTRAMUSCULAR | Status: AC
  Filled 2015-11-23: qty 5

## 2015-11-23 MED ORDER — SODIUM CHLORIDE 0.9 % IJ SOLN
INTRAMUSCULAR | Status: DC | PRN
  Administered 2015-11-23: 30 mL

## 2015-11-23 MED ORDER — LIDOCAINE HCL (CARDIAC) 20 MG/ML IV SOLN
INTRAVENOUS | Status: AC
  Filled 2015-11-23: qty 5

## 2015-11-23 MED ORDER — HYDROCODONE-ACETAMINOPHEN 7.5-325 MG PO TABS: 1.0000 | ORAL_TABLET | Freq: Once | ORAL | Status: DC | PRN

## 2015-11-23 MED ORDER — LIDOCAINE HCL (CARDIAC) 20 MG/ML IV SOLN
INTRAVENOUS | Status: DC | PRN
  Administered 2015-11-23: 80 mg via INTRAVENOUS

## 2015-11-23 MED ORDER — LEVOTHYROXINE SODIUM 25 MCG PO TABS
25.0000 ug | ORAL_TABLET | Freq: Every day | ORAL | Status: DC
  Administered 2015-11-24 – 2015-11-25 (×2): 25 ug via ORAL
  Filled 2015-11-23 (×3): qty 1

## 2015-11-23 MED ORDER — KETOROLAC TROMETHAMINE 30 MG/ML IJ SOLN
INTRAMUSCULAR | Status: DC | PRN
  Administered 2015-11-23: 30 mg

## 2015-11-23 MED ORDER — SODIUM CHLORIDE 0.9 % IR SOLN
Status: DC | PRN
  Administered 2015-11-23: 1000 mL

## 2015-11-23 MED ORDER — ROCURONIUM BROMIDE 100 MG/10ML IV SOLN
INTRAVENOUS | Status: AC
  Filled 2015-11-23: qty 1

## 2015-11-23 MED ORDER — METOCLOPRAMIDE HCL 10 MG PO TABS: 5.0000 mg | ORAL_TABLET | Freq: Three times a day (TID) | ORAL | Status: DC | PRN

## 2015-11-23 MED ORDER — DEXAMETHASONE SODIUM PHOSPHATE 10 MG/ML IJ SOLN
INTRAMUSCULAR | Status: DC | PRN
  Administered 2015-11-23: 10 mg via INTRAVENOUS

## 2015-11-23 MED ORDER — LACTATED RINGERS IV SOLN: INTRAVENOUS | Status: DC

## 2015-11-23 MED ORDER — POTASSIUM CHLORIDE IN NACL 20-0.9 MEQ/L-% IV SOLN
INTRAVENOUS | Status: DC
  Administered 2015-11-23 – 2015-11-24 (×2): via INTRAVENOUS
  Filled 2015-11-23 (×4): qty 1000

## 2015-11-23 MED ORDER — ESMOLOL HCL 100 MG/10ML IV SOLN
INTRAVENOUS | Status: DC | PRN
  Administered 2015-11-23: 10 mg via INTRAVENOUS
  Administered 2015-11-23 (×2): 30 mg via INTRAVENOUS
  Administered 2015-11-23 (×2): 10 mg via INTRAVENOUS

## 2015-11-23 MED ORDER — 0.9 % SODIUM CHLORIDE (POUR BTL) OPTIME
TOPICAL | Status: DC | PRN
  Administered 2015-11-23: 1000 mL

## 2015-11-23 MED ORDER — HYDROMORPHONE HCL 1 MG/ML IJ SOLN
0.2500 mg | INTRAMUSCULAR | Status: DC | PRN
  Administered 2015-11-23 (×2): 0.5 mg via INTRAVENOUS

## 2015-11-23 MED ORDER — PHENOL 1.4 % MT LIQD: 1.0000 | OROMUCOSAL | Status: DC | PRN

## 2015-11-23 MED ORDER — ONDANSETRON HCL 4 MG PO TABS: 4.0000 mg | ORAL_TABLET | Freq: Four times a day (QID) | ORAL | Status: DC | PRN

## 2015-11-23 MED ORDER — DEXAMETHASONE SODIUM PHOSPHATE 10 MG/ML IJ SOLN
10.0000 mg | Freq: Once | INTRAMUSCULAR | Status: AC
  Administered 2015-11-24: 10 mg via INTRAVENOUS
  Filled 2015-11-23: qty 1

## 2015-11-23 MED ORDER — FERROUS SULFATE 325 (65 FE) MG PO TABS
325.0000 mg | ORAL_TABLET | Freq: Three times a day (TID) | ORAL | Status: DC
  Administered 2015-11-24 – 2015-11-25 (×4): 325 mg via ORAL
  Filled 2015-11-23 (×8): qty 1

## 2015-11-23 MED ORDER — ASPIRIN EC 325 MG PO TBEC: 325.0000 mg | DELAYED_RELEASE_TABLET | Freq: Two times a day (BID) | ORAL | Status: DC

## 2015-11-23 MED ORDER — ONDANSETRON HCL 4 MG/2ML IJ SOLN: 4.0000 mg | Freq: Four times a day (QID) | INTRAMUSCULAR | Status: DC | PRN

## 2015-11-23 MED ORDER — ROCURONIUM BROMIDE 100 MG/10ML IV SOLN
INTRAVENOUS | Status: DC | PRN
  Administered 2015-11-23: 10 mg via INTRAVENOUS
  Administered 2015-11-23: 40 mg via INTRAVENOUS

## 2015-11-23 MED ORDER — ACETAMINOPHEN 650 MG RE SUPP: 650.0000 mg | Freq: Four times a day (QID) | RECTAL | Status: DC | PRN

## 2015-11-23 MED ORDER — POLYETHYLENE GLYCOL 3350 17 G PO PACK: 17.0000 g | PACK | Freq: Every day | ORAL | Status: DC | PRN

## 2015-11-23 MED ORDER — BUPIVACAINE-EPINEPHRINE 0.25% -1:200000 IJ SOLN
INTRAMUSCULAR | Status: DC | PRN
  Administered 2015-11-23: 30 mL

## 2015-11-23 MED ORDER — CEFAZOLIN SODIUM-DEXTROSE 2-4 GM/100ML-% IV SOLN
2.0000 g | Freq: Four times a day (QID) | INTRAVENOUS | Status: AC
  Administered 2015-11-23 – 2015-11-24 (×2): 2 g via INTRAVENOUS
  Filled 2015-11-23 (×2): qty 100

## 2015-11-23 MED ORDER — STERILE WATER FOR IRRIGATION IR SOLN
Status: DC | PRN
  Administered 2015-11-23: 2000 mL

## 2015-11-23 MED ORDER — ALBUTEROL SULFATE (2.5 MG/3ML) 0.083% IN NEBU
2.5000 mg | INHALATION_SOLUTION | Freq: Four times a day (QID) | RESPIRATORY_TRACT | Status: DC | PRN
Start: 2015-11-23 — End: 2015-11-25

## 2015-11-23 MED ORDER — ZOLPIDEM TARTRATE 5 MG PO TABS: 5.0000 mg | ORAL_TABLET | Freq: Every evening | ORAL | Status: DC | PRN

## 2015-11-23 MED ORDER — MIDAZOLAM HCL 2 MG/2ML IJ SOLN
INTRAMUSCULAR | Status: AC
  Filled 2015-11-23: qty 2

## 2015-11-23 MED ORDER — PROPOFOL 10 MG/ML IV BOLUS
INTRAVENOUS | Status: AC
  Filled 2015-11-23: qty 20

## 2015-11-23 MED ORDER — CEFAZOLIN SODIUM-DEXTROSE 2-3 GM-% IV SOLR
INTRAVENOUS | Status: AC
  Filled 2015-11-23: qty 50

## 2015-11-23 MED ORDER — BUPIVACAINE-EPINEPHRINE (PF) 0.25% -1:200000 IJ SOLN
INTRAMUSCULAR | Status: AC
  Filled 2015-11-23: qty 30

## 2015-11-23 MED ORDER — SUGAMMADEX SODIUM 200 MG/2ML IV SOLN
INTRAVENOUS | Status: AC
Start: 2015-11-23 — End: 2015-11-23
  Filled 2015-11-23: qty 2

## 2015-11-23 MED ORDER — ATORVASTATIN CALCIUM 40 MG PO TABS
40.0000 mg | ORAL_TABLET | Freq: Every day | ORAL | Status: DC
  Administered 2015-11-23 – 2015-11-25 (×3): 40 mg via ORAL
  Filled 2015-11-23 (×3): qty 1

## 2015-11-23 MED ORDER — KETOROLAC TROMETHAMINE 30 MG/ML IJ SOLN
INTRAMUSCULAR | Status: AC
  Filled 2015-11-23: qty 1

## 2015-11-23 MED ORDER — CEFAZOLIN SODIUM-DEXTROSE 2-3 GM-% IV SOLR: INTRAVENOUS | Status: DC | PRN

## 2015-11-23 MED ORDER — ALUM & MAG HYDROXIDE-SIMETH 200-200-20 MG/5ML PO SUSP: 30.0000 mL | ORAL | Status: DC | PRN

## 2015-11-23 MED ORDER — SENNA 8.6 MG PO TABS
1.0000 | ORAL_TABLET | Freq: Two times a day (BID) | ORAL | Status: DC
  Administered 2015-11-23 – 2015-11-25 (×4): 8.6 mg via ORAL

## 2015-11-23 MED ORDER — DEXTROSE 5 % IV SOLN
500.0000 mg | Freq: Four times a day (QID) | INTRAVENOUS | Status: DC | PRN
  Administered 2015-11-23: 500 mg via INTRAVENOUS
  Filled 2015-11-23 (×2): qty 5

## 2015-11-23 MED ORDER — SUGAMMADEX SODIUM 200 MG/2ML IV SOLN
INTRAVENOUS | Status: DC | PRN
  Administered 2015-11-23: 200 mg via INTRAVENOUS

## 2015-11-23 MED ORDER — OXYCODONE-ACETAMINOPHEN 5-325 MG PO TABS: 1.0000 | ORAL_TABLET | ORAL | Status: DC | PRN

## 2015-11-23 MED ORDER — HYDROMORPHONE HCL 1 MG/ML IJ SOLN: 1.0000 mg | INTRAMUSCULAR | Status: DC | PRN

## 2015-11-23 MED ORDER — OXYCODONE HCL 5 MG PO TABS
5.0000 mg | ORAL_TABLET | ORAL | Status: DC | PRN
  Administered 2015-11-23: 5 mg via ORAL
  Administered 2015-11-24: 10 mg via ORAL
  Administered 2015-11-24 – 2015-11-25 (×4): 5 mg via ORAL
  Administered 2015-11-25: 10 mg via ORAL
  Filled 2015-11-23 (×2): qty 2
  Filled 2015-11-23 (×6): qty 1

## 2015-11-23 MED ORDER — BISACODYL 5 MG PO TBEC: 5.0000 mg | DELAYED_RELEASE_TABLET | Freq: Every day | ORAL | Status: DC | PRN

## 2015-11-23 MED ORDER — CEFAZOLIN SODIUM 1 G IJ SOLR
2.0000 g | INTRAMUSCULAR | Status: AC
  Administered 2015-11-23: 2 g via INTRAVENOUS
  Filled 2015-11-23: qty 20

## 2015-11-23 MED ORDER — DEXAMETHASONE SODIUM PHOSPHATE 10 MG/ML IJ SOLN
10.0000 mg | Freq: Once | INTRAMUSCULAR | Status: DC
  Filled 2015-11-23: qty 1

## 2015-11-23 MED ORDER — ACETAMINOPHEN 325 MG PO TABS
650.0000 mg | ORAL_TABLET | Freq: Four times a day (QID) | ORAL | Status: DC | PRN
  Administered 2015-11-24: 650 mg via ORAL
  Filled 2015-11-23: qty 2

## 2015-11-23 MED ORDER — MENTHOL 3 MG MT LOZG: 1.0000 | LOZENGE | OROMUCOSAL | Status: DC | PRN

## 2015-11-23 MED ORDER — ONDANSETRON HCL 4 MG/2ML IJ SOLN
INTRAMUSCULAR | Status: AC
  Filled 2015-11-23: qty 2

## 2015-11-23 MED ORDER — FENTANYL CITRATE (PF) 100 MCG/2ML IJ SOLN
INTRAMUSCULAR | Status: DC | PRN
  Administered 2015-11-23 (×5): 50 ug via INTRAVENOUS
  Administered 2015-11-23: 100 ug via INTRAVENOUS

## 2015-11-23 MED ORDER — FENTANYL CITRATE (PF) 100 MCG/2ML IJ SOLN
INTRAMUSCULAR | Status: AC
  Filled 2015-11-23: qty 2

## 2015-11-23 MED ORDER — ETOMIDATE 2 MG/ML IV SOLN
INTRAVENOUS | Status: DC | PRN
  Administered 2015-11-23: 16 mg via INTRAVENOUS

## 2015-11-23 MED ORDER — FLEET ENEMA 7-19 GM/118ML RE ENEM: 1.0000 | ENEMA | Freq: Once | RECTAL | Status: DC | PRN

## 2015-11-23 SURGICAL SUPPLY — 58 items
BAG ZIPLOCK 12X15 (MISCELLANEOUS) ×4 IMPLANT
BANDAGE ACE 4X5 VEL STRL LF (GAUZE/BANDAGES/DRESSINGS) ×2 IMPLANT
BANDAGE ACE 6X5 VEL STRL LF (GAUZE/BANDAGES/DRESSINGS) ×2 IMPLANT
BLADE SAG 18X100X1.27 (BLADE) ×2 IMPLANT
BLADE SAW SGTL 13.0X1.19X90.0M (BLADE) ×2 IMPLANT
CAP KNEE TOTAL 3 SIGMA ×2 IMPLANT
CEMENT HV SMART SET (Cement) ×4 IMPLANT
CLOTH BEACON ORANGE TIMEOUT ST (SAFETY) ×2 IMPLANT
CUFF TOURN SGL QUICK 34 (TOURNIQUET CUFF) ×1
CUFF TRNQT CYL 34X4X40X1 (TOURNIQUET CUFF) ×1 IMPLANT
DECANTER SPIKE VIAL GLASS SM (MISCELLANEOUS) ×2 IMPLANT
DRAPE U-SHAPE 47X51 STRL (DRAPES) ×2 IMPLANT
DRSG AQUACEL AG ADV 3.5X10 (GAUZE/BANDAGES/DRESSINGS) ×2 IMPLANT
DRSG TEGADERM 4X4.75 (GAUZE/BANDAGES/DRESSINGS) ×2 IMPLANT
DURAPREP 26ML APPLICATOR (WOUND CARE) ×4 IMPLANT
ELECT REM PT RETURN 9FT ADLT (ELECTROSURGICAL) ×2
ELECTRODE REM PT RTRN 9FT ADLT (ELECTROSURGICAL) ×1 IMPLANT
EVACUATOR 1/8 PVC DRAIN (DRAIN) ×2 IMPLANT
GAUZE SPONGE 2X2 8PLY STRL LF (GAUZE/BANDAGES/DRESSINGS) ×1 IMPLANT
GLOVE BIOGEL PI IND STRL 7.5 (GLOVE) ×3 IMPLANT
GLOVE BIOGEL PI IND STRL 8 (GLOVE) ×2 IMPLANT
GLOVE BIOGEL PI INDICATOR 7.5 (GLOVE) ×3
GLOVE BIOGEL PI INDICATOR 8 (GLOVE) ×2
GLOVE INDICATOR 6.5 STRL GRN (GLOVE) ×2 IMPLANT
GLOVE SURG ORTHO 8.0 STRL STRW (GLOVE) ×2 IMPLANT
GLOVE SURG ORTHO 9.0 STRL STRW (GLOVE) ×2 IMPLANT
GLOVE SURG SS PI 6.5 STRL IVOR (GLOVE) ×2 IMPLANT
GLOVE SURG SS PI 7.5 STRL IVOR (GLOVE) ×4 IMPLANT
GOWN STRL REUS W/ TWL LRG LVL3 (GOWN DISPOSABLE) ×1 IMPLANT
GOWN STRL REUS W/TWL LRG LVL3 (GOWN DISPOSABLE) ×1
GOWN STRL REUS W/TWL XL LVL3 (GOWN DISPOSABLE) ×6 IMPLANT
HANDPIECE INTERPULSE COAX TIP (DISPOSABLE) ×1
IMMOBILIZER KNEE 20 (SOFTGOODS) ×2
IMMOBILIZER KNEE 20 THIGH 36 (SOFTGOODS) ×1 IMPLANT
LIQUID BAND (GAUZE/BANDAGES/DRESSINGS) ×2 IMPLANT
PACK TOTAL KNEE CUSTOM (KITS) ×2 IMPLANT
POSITIONER SURGICAL ARM (MISCELLANEOUS) ×2 IMPLANT
SET HNDPC FAN SPRY TIP SCT (DISPOSABLE) ×1 IMPLANT
SET PAD KNEE POSITIONER (MISCELLANEOUS) ×2 IMPLANT
SPONGE GAUZE 2X2 STER 10/PKG (GAUZE/BANDAGES/DRESSINGS) ×1
SPONGE LAP 18X18 X RAY DECT (DISPOSABLE) ×2 IMPLANT
SPONGE SURGIFOAM ABS GEL 100 (HEMOSTASIS) ×2 IMPLANT
STOCKINETTE 6  STRL (DRAPES) ×1
STOCKINETTE 6 STRL (DRAPES) ×1 IMPLANT
SUCTION FRAZIER HANDLE 12FR (TUBING) ×1
SUCTION TUBE FRAZIER 12FR DISP (TUBING) ×1 IMPLANT
SUT MNCRL AB 3-0 PS2 18 (SUTURE) ×2 IMPLANT
SUT VIC AB 1 CT1 27 (SUTURE) ×4
SUT VIC AB 1 CT1 27XBRD ANTBC (SUTURE) ×4 IMPLANT
SUT VIC AB 2-0 CT1 27 (SUTURE) ×3
SUT VIC AB 2-0 CT1 TAPERPNT 27 (SUTURE) ×3 IMPLANT
SUT VLOC 180 0 24IN GS25 (SUTURE) ×2 IMPLANT
SYR 50ML LL SCALE MARK (SYRINGE) ×2 IMPLANT
TAPE STRIPS DRAPE STRL (GAUZE/BANDAGES/DRESSINGS) ×2 IMPLANT
TOWER CARTRIDGE SMART MIX (DISPOSABLE) ×2 IMPLANT
TRAY FOLEY W/METER SILVER 16FR (SET/KITS/TRAYS/PACK) ×2 IMPLANT
WRAP KNEE MAXI GEL POST OP (GAUZE/BANDAGES/DRESSINGS) ×2 IMPLANT
YANKAUER SUCT BULB TIP 10FT TU (MISCELLANEOUS) ×2 IMPLANT

## 2015-11-23 NOTE — Transfer of Care (Signed)
Immediate Anesthesia Transfer of Care Note  Patient: Zachary Ball  Procedure(s) Performed: Procedure(s): LEFT TOTAL KNEE ARTHROPLASTY (Left)  Patient Location: PACU  Anesthesia Type:General  Level of Consciousness: awake, alert  and oriented  Airway & Oxygen Therapy: Patient Spontanous Breathing and Patient connected to face mask oxygen  Post-op Assessment: Report given to RN and Post -op Vital signs reviewed and stable  Post vital signs: Reviewed and stable  Last Vitals:  Filed Vitals:   11/23/15 1012  BP: 149/64  Pulse: 79  Temp: 36.6 C  Resp: 18    Complications: No apparent anesthesia complications

## 2015-11-23 NOTE — Anesthesia Postprocedure Evaluation (Signed)
Anesthesia Post Note  Patient: Zachary Ball  Procedure(s) Performed: Procedure(s) (LRB): LEFT TOTAL KNEE ARTHROPLASTY (Left)  Patient location during evaluation: PACU Anesthesia Type: General Level of consciousness: awake and alert Pain management: pain level controlled Vital Signs Assessment: post-procedure vital signs reviewed and stable Respiratory status: spontaneous breathing, nonlabored ventilation, respiratory function stable and patient connected to nasal cannula oxygen Cardiovascular status: blood pressure returned to baseline and stable Postop Assessment: no signs of nausea or vomiting Anesthetic complications: no    Last Vitals:  Filed Vitals:   11/23/15 1707 11/23/15 1816  BP: 156/45 151/97  Pulse: 68 68  Temp: 36.4 C 36.6 C  Resp: 12 13    Last Pain:  Filed Vitals:   11/23/15 1817  PainSc: 0-No pain                 Catalina Gravel

## 2015-11-23 NOTE — Op Note (Signed)
DATE OF SURGERY:  11/23/2015  TIME: 3:29 PM  PATIENT NAME:  Zachary Ball    AGE: 82 y.o.   PRE-OPERATIVE DIAGNOSIS:  left knee oa  POST-OPERATIVE DIAGNOSIS:  left knee oa  PROCEDURE:  Procedure(s): LEFT TOTAL KNEE ARTHROPLASTY  SURGEON:  Adasyn Mcadams ANDREW  ASSISTANT:  Bryson Stilwell, PA-C, present and scrubbed throughout the case, critical for assistance with exposure, retraction, instrumentation, and closure.  OPERATIVE IMPLANTS: Depuy PFC Sigma Rotating Platform.  Femur size 5, Tibia size 5, Patella size 38 3-peg oval button, with a 10 mm polyethylene insert.   PREOPERATIVE INDICATIONS:   Zachary Ball is a 82 y.o. year old male with end stage bone on bone arthritis of the knee who failed conservative treatment and elected for Total Knee Arthroplasty.   The risks, benefits, and alternatives were discussed at length including but not limited to the risks of infection, bleeding, nerve injury, stiffness, blood clots, the need for revision surgery, cardiopulmonary complications, among others, and they were willing to proceed.  OPERATIVE DESCRIPTION:  The patient was brought to the operative room and placed in a supine position.  Spinal anesthesia was administered.  IV antibiotics were given.  The lower extremity was prepped and draped in the usual sterile fashion.  Time out was performed.  The leg was elevated and exsanguinated and the tourniquet was inflated.  Anterior quadriceps tendon splitting approach was performed.  The patella was retracted and osteophytes were removed.  The anterior horn of the medial and lateral meniscus was removed and cruciate ligaments resected.   The distal femur was opened with the drill and the intramedullary distal femoral cutting jig was utilized, set at 5 degrees resecting 10 mm off the distal femur.  Care was taken to protect the collateral ligaments.  The distal femoral sizing jig was applied, taking care to avoid notching.  Then the  4-in-1 cutting jig was applied and the anterior and posterior femur was cut, along with the chamfer cuts.    Then the extramedullary tibial cutting jig was utilized making the appropriate cut using the anterior tibial crest as a reference building in appropriate posterior slope.  Care was taken during the cut to protect the medial and collateral ligaments.  The proximal tibia was removed along with the posterior horns of the menisci.   The posterior medial femoral osteophytes and posterior lateral femoral osteophytes were removed.    The flexion gap was then measured and was symmetric with the extension gap, measured at 10.  I completed the distal femoral preparation using the appropriate jig to prepare the box.  The patella was then measured, and cut with the saw.    The proximal tibia sized and prepared accordingly with the reamer and the punch, and then all components were trialed with the trial insert.  The knee was found to have excellent balance and full motion.    The above named components were then cemented into place and all excess cement was removed.  The trial polyethylene component was in place during cementation, and then was exchanged for the real polyethylene component.    The knee was easily taken through a range of motion and the patella tracked well and the knee irrigated copiously and the parapatellar and subcutaneous tissue closed with vicryl, and monocryl with steri strips for the skin.  The arthrotomy was closed at 90 of flexion. The wounds were dressed with sterile gauze and the tourniquet released and the patient was awakened and returned to the PACU  in stable and satisfactory condition.  There were no complications.  Total tourniquet time was 90 minutes.

## 2015-11-23 NOTE — Anesthesia Preprocedure Evaluation (Addendum)
Anesthesia Evaluation  Patient identified by MRN, date of birth, ID band Patient awake    Reviewed: Allergy & Precautions, NPO status , Patient's Chart, lab work & pertinent test results  Airway Mallampati: II  TM Distance: >3 FB Neck ROM: Full    Dental   Pulmonary former smoker,    breath sounds clear to auscultation       Cardiovascular + CAD and + Peripheral Vascular Disease  + dysrhythmias + Valvular Problems/Murmurs AS  Rhythm:Regular Rate:Normal  Moderate-Severe AS. Normal EF. Severe LVH.   Neuro/Psych negative neurological ROS     GI/Hepatic Neg liver ROS, GERD  ,  Endo/Other  Hypothyroidism   Renal/GU negative Renal ROS     Musculoskeletal  (+) Arthritis ,   Abdominal   Peds  Hematology negative hematology ROS (+)   Anesthesia Other Findings   Reproductive/Obstetrics                            Lab Results  Component Value Date   WBC 7.7 11/15/2015   HGB 14.6 11/15/2015   HCT 43.0 11/15/2015   MCV 91.3 11/15/2015   PLT 197 11/15/2015   Lab Results  Component Value Date   CREATININE 0.89 11/05/2015   BUN 21 11/05/2015   NA 140 11/05/2015   K 4.7 11/05/2015   CL 106 11/05/2015   CO2 25 11/05/2015   Lab Results  Component Value Date   INR 1.11 11/15/2015    Anesthesia Physical Anesthesia Plan  ASA: III  Anesthesia Plan: General   Post-op Pain Management:    Induction: Intravenous  Airway Management Planned: Oral ETT  Additional Equipment: Arterial line  Intra-op Plan:   Post-operative Plan: Extubation in OR  Informed Consent: I have reviewed the patients History and Physical, chart, labs and discussed the procedure including the risks, benefits and alternatives for the proposed anesthesia with the patient or authorized representative who has indicated his/her understanding and acceptance.   Dental advisory given  Plan Discussed with: CRNA  Anesthesia  Plan Comments:         Anesthesia Quick Evaluation

## 2015-11-23 NOTE — Interval H&P Note (Signed)
History and Physical Interval Note:  11/23/2015 1:06 PM  Zachary Ball  has presented today for surgery, with the diagnosis of left knee oa  The various methods of treatment have been discussed with the patient and family. After consideration of risks, benefits and other options for treatment, the patient has consented to  Procedure(s): LEFT TOTAL KNEE ARTHROPLASTY (Left) as a surgical intervention .  The patient's history has been reviewed, patient examined, no change in status, stable for surgery.  I have reviewed the patient's chart and labs.  Questions were answered to the patient's satisfaction.     Edye Hainline ANDREW

## 2015-11-23 NOTE — Anesthesia Procedure Notes (Addendum)
Procedure Name: Intubation Date/Time: 11/23/2015 1:25 PM Performed by: West Pugh Pre-anesthesia Checklist: Patient identified, Emergency Drugs available, Suction available, Patient being monitored and Timeout performed Patient Re-evaluated:Patient Re-evaluated prior to inductionOxygen Delivery Method: Circle system utilized Preoxygenation: Pre-oxygenation with 100% oxygen Intubation Type: IV induction Ventilation: Mask ventilation without difficulty and Oral airway inserted - appropriate to patient size Laryngoscope Size: Mac and 3 Grade View: Grade I Tube type: Oral Tube size: 7.5 mm Number of attempts: 1 Airway Equipment and Method: Stylet Placement Confirmation: ETT inserted through vocal cords under direct vision,  positive ETCO2,  CO2 detector and breath sounds checked- equal and bilateral Secured at: 22 cm Tube secured with: Tape Dental Injury: Teeth and Oropharynx as per pre-operative assessment

## 2015-11-23 NOTE — Interval H&P Note (Signed)
History and Physical Interval Note:  11/23/2015 1:05 PM  Zachary Ball  has presented today for surgery, with the diagnosis of left knee oa  The various methods of treatment have been discussed with the patient and family. After consideration of risks, benefits and other options for treatment, the patient has consented to  Procedure(s): LEFT TOTAL KNEE ARTHROPLASTY (Left) as a surgical intervention .  The patient's history has been reviewed, patient examined, no change in status, stable for surgery.  I have reviewed the patient's chart and labs.  Questions were answered to the patient's satisfaction.     Radie Berges ANDREW

## 2015-11-24 LAB — BASIC METABOLIC PANEL
Anion gap: 9 (ref 5–15)
BUN: 19 mg/dL (ref 6–20)
CHLORIDE: 106 mmol/L (ref 101–111)
CO2: 25 mmol/L (ref 22–32)
CREATININE: 0.97 mg/dL (ref 0.61–1.24)
Calcium: 8.3 mg/dL — ABNORMAL LOW (ref 8.9–10.3)
GFR calc Af Amer: >60 mL/min (ref >60)
GFR calc non Af Amer: >60 mL/min (ref >60)
Glucose, Bld: 137 mg/dL — ABNORMAL HIGH (ref 65–99)
POTASSIUM: 4.4 mmol/L (ref 3.5–5.1)
Sodium: 140 mmol/L (ref 135–145)

## 2015-11-24 LAB — CBC
HEMATOCRIT: 35.2 % — AB (ref 39.0–52.0)
HEMOGLOBIN: 12.1 g/dL — AB (ref 13.0–17.0)
MCH: 32 pg (ref 26.0–34.0)
MCHC: 34.4 g/dL (ref 30.0–36.0)
MCV: 93.1 fL (ref 78.0–100.0)
Platelets: 161 10*3/uL (ref 150–400)
RBC: 3.78 MIL/uL — AB (ref 4.22–5.81)
RDW: 13.7 % (ref 11.5–15.5)
WBC: 15.1 10*3/uL — ABNORMAL HIGH (ref 4.0–10.5)

## 2015-11-24 NOTE — Evaluation (Signed)
Occupational Therapy Evaluation Patient Details Name: Zachary Ball MRN: [id number] DOB: 1924/10/14 Today's Date: 11/24/2015    History of Present Illness LTKA   Clinical Impression   Pt is s/p TKA resulting in the deficits listed below (see OT Problem List).  Pt will benefit from skilled OT to increase their safety and independence with ADL and functional mobility for ADL to facilitate discharge to venue listed below.        Follow Up Recommendations  Home health OT;Supervision/Assistance - 24 hour    Equipment Recommendations  3 in 1 bedside comode    Recommendations for Other Services       Precautions / Restrictions Restrictions Weight Bearing Restrictions: No      Mobility Bed Mobility Overal bed mobility: Needs Assistance             General bed mobility comments: pt in chair  Transfers Overall transfer level: Needs assistance Equipment used: Rolling walker (2 wheeled) Transfers: Sit to/from Stand Sit to Stand: Min assist                   ADL Overall ADL's : Needs assistance/impaired                     Lower Body Dressing: Moderate assistance;Sit to/from stand;Cueing for sequencing;Cueing for safety   Toilet Transfer: Minimal assistance;RW   Toileting- Clothing Manipulation and Hygiene: Minimal assistance;Sit to/from stand;Cueing for sequencing;Cueing for safety       Functional mobility during ADLs: Moderate assistance;Rolling walker;Cueing for safety                 Pertinent Vitals/Pain Pain Assessment: 0-10 Pain Score: 2  Pain Location: L knee Pain Descriptors / Indicators: Sore Pain Intervention(s): Monitored during session        Extremity/Trunk Assessment Upper Extremity Assessment Upper Extremity Assessment: Generalized weakness           Communication Communication Communication: No difficulties   Cognition Arousal/Alertness: Awake/alert Behavior During Therapy: WFL for tasks  assessed/performed Overall Cognitive Status: Within Functional Limits for tasks assessed                                Home Living Family/patient expects to be discharged to:: Private residence Living Arrangements: Spouse/significant other Available Help at Discharge: Family Type of Home: House       Home Layout: Two level     Bathroom Shower/Tub: Tub/shower unit Shower/tub characteristics: Architectural technologist: Standard                Prior Functioning/Environment Level of Independence: Independent             OT Diagnosis: Generalized weakness   OT Problem List: Decreased strength;Decreased knowledge of use of DME or AE   OT Treatment/Interventions: Self-care/ADL training;DME and/or AE instruction;Patient/family education    OT Goals(Current goals can be found in the care plan section) Acute Rehab OT Goals Patient Stated Goal: home OT Goal Formulation: With patient Time For Goal Achievement: 12/08/15 Potential to Achieve Goals: Good  OT Frequency: Min 2X/week              End of Session Equipment Utilized During Treatment: Rolling walker Nurse Communication: Mobility status  Activity Tolerance: Patient tolerated treatment well Patient left: in chair   Time: [id number] OT Time Calculation (min): 18 min Charges:  OT General Charges $OT Visit: 1 Procedure OT Evaluation $OT Eval Low  Complexity: 1 Procedure G-Codes:    Payton Mccallum D 12/19/15, 11:19 AM

## 2015-11-24 NOTE — Progress Notes (Signed)
Subjective: 1 Day Post-Op Procedure(s) (LRB): LEFT TOTAL KNEE ARTHROPLASTY (Left)  Patient reports pain as mild to moderate.  Tolerating POs well.  Denies BM or flatulence.  Denies fever, chills, N/V.  Objective:   VITALS:  Temp:  [97.4 F (36.3 C)-98.7 F (37.1 C)] 98.7 F (37.1 C) (04/01 0500) Pulse Rate:  [57-79] 57 (04/01 0500) Resp:  [10-20] 20 (04/01 0500) BP: (104-178)/(45-97) 104/50 mmHg (04/01 0500) SpO2:  [90 %-99 %] 96 % (04/01 0500) Weight:  [99.791 kg (220 lb)] 99.791 kg (220 lb) (03/31 1707)  General: WDWN patient in NAD. Psych:  Appropriate mood and affect. Neuro:  A&O x 3, Moving all extremities, sensation intact to light touch HEENT:  EOMs intact Chest:  Even non-labored respirations Skin:  Dressing C/D/I, no rashes or lesions.  Drain pulled, dry dressing applied Extremities: warm/dry, mild edema, no erythema or echymosis.  No lymphadenopathy. Pulses: Femoral 2+ MSK:  ROM: Full ankle ROM, MMT: patient is able to perform quad set, (-) Homan's    LABS  Recent Labs  11/24/15 0416  HGB 12.1*  WBC 15.1*  PLT 161    Recent Labs  11/24/15 0416  NA 140  K 4.4  CL 106  CO2 25  BUN 19  CREATININE 0.97  GLUCOSE 137*   No results for input(s): LABPT, INR in the last 72 hours.   Assessment/Plan: 1 Day Post-Op Procedure(s) (LRB): LEFT TOTAL KNEE ARTHROPLASTY (Left)  Up with therapy  WBAT LLE Lovenox for DVT prophylaxis. Drain pulled, dry dressing applied. Plan for D/C home tomorrow if patient is ready.    Mechele Claude, PA-C, [employer] [employer] Office:  [phone]

## 2015-11-24 NOTE — Evaluation (Signed)
Physical Therapy Evaluation Patient Details Name: Zachary Ball MRN: [id number] DOB: Jul 22, 1925 Today's Date: 11/24/2015   History of Present Illness  LTKA  Clinical Impression  The patient is progressing very well. Plans to stay at daughter's home. Patient will benefit from PT to address problems listed in the note below.    Follow Up Recommendations Home health PT;Supervision/Assistance - 24 hour    Equipment Recommendations  None recommended by PT    Recommendations for Other Services       Precautions / Restrictions Precautions Precautions: Fall;Knee Required Braces or Orthoses: Knee Immobilizer - Left Knee Immobilizer - Left: Discontinue once straight leg raise with < 10 degree lag      Mobility  Bed Mobility Overal bed mobility: Needs Assistance Bed Mobility: Supine to Sit     Supine to sit: Min assist     General bed mobility comments: support L  leg.  Transfers Overall transfer level: Needs assistance Equipment used: Rolling walker (2 wheeled) Transfers: Sit to/from Stand Sit to Stand: Min assist         General transfer comment: cues for hand and L leg position  Ambulation/Gait Ambulation/Gait assistance: Min assist Ambulation Distance (Feet): 120 Feet Assistive device: Rolling walker (2 wheeled) Gait Pattern/deviations: Step-to pattern;Step-through pattern     General Gait Details: cues for sequence  Stairs            Wheelchair Mobility    Modified Rankin (Stroke Patients Only)       Balance                                             Pertinent Vitals/Pain Pain Assessment: 0-10 Pain Score: 2  Pain Location: L KNEE Pain Descriptors / Indicators: Aching;Tightness Pain Intervention(s): Monitored during session;Premedicated before session;Repositioned;Ice applied    Home Living Family/patient expects to be discharged to:: Private residence Living Arrangements: Spouse/significant other Available Help at  Discharge: Family Type of Home: House Home Access: Stairs to enter Entrance Stairs-Rails: Can reach both Entrance Stairs-Number of Steps: 3 Home Layout: Two level Home Equipment: Walker - 2 wheels      Prior Function Level of Independence: Independent               Hand Dominance        Extremity/Trunk Assessment   Upper Extremity Assessment: Defer to OT evaluation           Lower Extremity Assessment: LLE deficits/detail   LLE Deficits / Details: able to raise leg, knee flexion 80   Cervical / Trunk Assessment: Normal  Communication   Communication: No difficulties  Cognition Arousal/Alertness: Awake/alert Behavior During Therapy: WFL for tasks assessed/performed Overall Cognitive Status: Within Functional Limits for tasks assessed                      General Comments      Exercises Total Joint Exercises Ankle Circles/Pumps: AROM;Both;10 reps Quad Sets: AROM;Both;10 reps Short Arc QuadSinclair Ship;Left;10 reps Heel Slides: AAROM;Left;10 reps Hip ABduction/ADduction: AAROM;Left;10 reps Straight Leg Raises: AAROM;Left;10 reps      Assessment/Plan    PT Assessment Patient needs continued PT services  PT Diagnosis Difficulty walking   PT Problem List Decreased strength;Decreased range of motion;Decreased activity tolerance;Decreased mobility;Decreased knowledge of precautions;Decreased safety awareness;Decreased knowledge of use of DME;Pain  PT Treatment Interventions DME instruction;Gait training;Stair training;Functional mobility training;Therapeutic activities;Therapeutic  exercise;Patient/family education   PT Goals (Current goals can be found in the Care Plan section) Acute Rehab PT Goals Patient Stated Goal: home PT Goal Formulation: With patient/family Time For Goal Achievement: 11/27/15 Potential to Achieve Goals: Good    Frequency 7X/week   Barriers to discharge        Co-evaluation               End of Session Equipment  Utilized During Treatment: Gait belt Activity Tolerance: Patient tolerated treatment well Patient left: in chair;with call bell/phone within reach;with chair alarm set Nurse Communication: Mobility status         Time: [id number] PT Time Calculation (min) (ACUTE ONLY): 49 min   Charges:   PT Evaluation $PT Eval Low Complexity: 1 Procedure PT Treatments $Gait Training: 8-22 mins $Therapeutic Exercise: 8-22 mins   PT G Codes:        Marcelino Freestone PT [id number]   11/24/2015, 12:19 PM

## 2015-11-24 NOTE — Progress Notes (Signed)
Physical Therapy Treatment Patient Details Name: Zachary Ball MRN: [id number] DOB: 03/01/1925 Today's Date: 11/24/2015    History of Present Illness LTKA    PT Comments    Progressing in mobility and ambulation,. Not wearing KI. Will begin stair training in AM. Likely will wear KI for flight of steps.  Follow Up Recommendations  Home health PT;Supervision/Assistance - 24 hour     Equipment Recommendations  None recommended by PT    Recommendations for Other Services       Precautions / Restrictions Precautions Precautions: Fall;Knee Required Braces or Orthoses: Knee Immobilizer - Left Knee Immobilizer - Left: Discontinue once straight leg raise with < 10 degree lag    Mobility  Bed Mobility Overal bed mobility: Needs Assistance Bed Mobility: Sit to Supine     Supine to sit: Min assist Sit to supine: Min assist   General bed mobility comments: support L  leg.  Transfers   Equipment used: Rolling walker (2 wheeled) Transfers: Sit to/from Stand Sit to Stand: [employer] transfer comment: cues for hand and L leg position  Ambulation/Gait Ambulation/Gait assistance: Min guard Ambulation Distance (Feet): 130 Feet Assistive device: Rolling walker (2 wheeled) Gait Pattern/deviations: Step-to pattern     General Gait Details: cues for sequence and posture   Stairs            Wheelchair Mobility    Modified Rankin (Stroke Patients Only)       Balance                                    Cognition Arousal/Alertness: Awake/alert Behavior During Therapy: WFL for tasks assessed/performed Overall Cognitive Status: Within Functional Limits for tasks assessed                      Exercises Total Joint Exercises Ankle Circles/Pumps: AROM;Both;10 reps Quad Sets: AROM;Both;10 reps Short Arc QuadSinclair Ship;Left;10 reps Heel Slides: AAROM;Left;10 reps Hip ABduction/ADduction: AAROM;Left;10 reps Straight Leg Raises:  AAROM;Left;10 reps    General Comments        Pertinent Vitals/Pain Pain Score: 2  Pain Location: L knee Pain Descriptors / Indicators: Aching;Tightness Pain Intervention(s): Monitored during session;Premedicated before session;Repositioned;Ice applied    Home Living Family/patient expects to be discharged to:: Private residence Living Arrangements: Spouse/significant other Available Help at Discharge: Family   Home Access: Stairs to enter Entrance Stairs-Rails: Can reach both Home Layout: Two level Home Equipment: Environmental consultant - 2 wheels      Prior Function            PT Goals (current goals can now be found in the care plan section) Acute Rehab PT Goals Patient Stated Goal: home PT Goal Formulation: With patient/family Time For Goal Achievement: 11/27/15 Potential to Achieve Goals: Good Progress towards PT goals: Progressing toward goals    Frequency  7X/week    PT Plan Current plan remains appropriate    Co-evaluation             End of Session Equipment Utilized During Treatment: Gait belt Activity Tolerance: Patient tolerated treatment well Patient left: in bed;with call bell/phone within reach;with bed alarm set;with family/visitor present     Time: [id number] PT Time Calculation (min) (ACUTE ONLY): 21 min  Charges:  $Gait Training: 8-22 mins $Therapeutic Exercise: 8-22 mins  G Codes:      Marcelino Freestone PT [id number]  11/24/2015, 3:46 PM

## 2015-11-25 LAB — CBC
HEMATOCRIT: 31.9 % — AB (ref 39.0–52.0)
Hemoglobin: 10.8 g/dL — ABNORMAL LOW (ref 13.0–17.0)
MCH: 31.6 pg (ref 26.0–34.0)
MCHC: 33.9 g/dL (ref 30.0–36.0)
MCV: 93.3 fL (ref 78.0–100.0)
PLATELETS: 143 10*3/uL — AB (ref 150–400)
RBC: 3.42 MIL/uL — AB (ref 4.22–5.81)
RDW: 13.9 % (ref 11.5–15.5)
WBC: 10.2 10*3/uL (ref 4.0–10.5)

## 2015-11-25 NOTE — Progress Notes (Signed)
Occupational Therapy Treatment Patient Details Name: Zachary Ball MRN: [id number] DOB: July 12, 1925 Today's Date: 11/25/2015    History of present illness LTKA   OT comments  Pt practiced getting in the shower- but pt has a tub at home.  Feel pt will need [place] therapist to A with tub transfer.  Follow Up Recommendations  Home health OT;Supervision/Assistance - 24 hour    Equipment Recommendations  3 in 1 bedside comode;Tub/shower seat       Precautions / Restrictions Precautions Precautions: Fall;Knee Required Braces or Orthoses: Knee Immobilizer - Left Knee Immobilizer - Left: Discontinue once straight leg raise with < 10 degree lag       Mobility Bed Mobility               General bed mobility comments: pt in chair  Transfers Overall transfer level: Needs assistance Equipment used: Rolling walker (2 wheeled) Transfers: Sit to/from [facility] Sit to Stand: Supervision Stand pivot transfers: Supervision       General transfer comment: cues for hand placement        ADL Overall ADL's : Needs assistance/impaired     Grooming: Standing;Supervision/safety;Oral care                   Toilet Transfer: Supervision/safety;RW;Regular Museum/gallery exhibitions officer and Hygiene: Supervision/safety;Sit to/from stand   Tub/ Shower Transfer: Walk-in shower;Cueing for safety;Cueing for sequencing   Functional mobility during ADLs: Rolling walker;Cueing for safety                  Cognition   Behavior During Therapy: WFL for tasks assessed/performed Overall Cognitive Status: Within Functional Limits for tasks assessed                                    Pertinent Vitals/ Pain       Pain Score: 3  Pain Location: L knee Pain Descriptors / Indicators: Sore Pain Intervention(s): Monitored during session         Frequency Min 2X/week     Progress Toward Goals  OT Goals(current goals can now be found in  the care plan section)  Progress towards OT goals: Progressing toward goals     Plan Discharge plan remains appropriate       End of Session Equipment Utilized During Treatment: Rolling walker   Activity Tolerance Patient tolerated treatment well   Patient Left in chair   Nurse Communication Mobility status        Time: [id number] OT Time Calculation (min): 15 min  Charges: OT General Charges $OT Visit: 1 Procedure OT Treatments $Self Care/Home Management : 8-22 mins  Zachary Ball D 11/25/2015, 10:20 AM

## 2015-11-25 NOTE — Progress Notes (Signed)
RN called for [facility] transport for patient to family residence.   [facility] confirmed transportation.   RN reviewed discharge instructions with patient and family. All questions answered.   Paperwork and prescriptions given.

## 2015-11-25 NOTE — Discharge Instructions (Signed)
[address] [place], [place] [address] [phone]  TOTAL KNEE REPLACEMENT POSTOPERATIVE DIRECTIONS    Knee Rehabilitation, Guidelines Following Surgery  Results after knee surgery are often greatly improved when you follow the exercise, range of motion and muscle strengthening exercises prescribed by your doctor. Safety measures are also important to protect the knee from further injury. Any time any of these exercises cause you to have increased pain or swelling in your knee joint, decrease the amount until you are comfortable again and slowly increase them. If you have problems or questions, call your caregiver or physical therapist for advice.   WEIGHT BEARING Weight bearing as tolerated with assist device (walker, cane, etc) as directed, use it as long as suggested by your surgeon or therapist, typically at least 4-6 weeks.  HOME CARE INSTRUCTIONS  Remove items at home which could result in a fall. This includes throw rugs or furniture in walking pathways.  Continue medications as instructed at time of discharge. You may have some home medications which will be placed on hold until you complete the course of blood thinner medication.  You may start showering once you are discharged home but do not submerge the incision under water. Just pat the incision dry and apply a dry gauze dressing on daily. Walk with walker as instructed.  You may resume a sexual relationship in one month or when given the OK by your doctor.   Use walker as long as suggested by your caregivers.  Avoid periods of inactivity such as sitting longer than an hour when not asleep. This helps prevent blood clots.  You may put full weight on your legs and walk as much as is comfortable.  You may return to work once you are cleared by your doctor.  Do not drive a car for 6 weeks or until released by you surgeon.   Do not drive while taking narcotics.  Wear the elastic  stockings for three weeks following surgery during the day but you may remove then at night. Make sure you keep all of your appointments after your operation with all of your doctors and caregivers. You should call the office at the above phone number and make an appointment for approximately two weeks after the date of your surgery. Do not remove your surgical dressing. The dressing is waterproof; you may take showers in 3 days, but do not take tub baths or submerge the dressing. Please pick up a stool softener and laxative for home use as long as you are requiring pain medications.  ICE to the affected knee every three hours for 30 minutes at a time and then as needed for pain and swelling.  Continue to use ice on the knee for pain and swelling from surgery. You may notice swelling that will progress down to the foot and ankle.  This is normal after surgery.  Elevate the leg when you are not up walking on it.   It is important for you to complete the blood thinner medication as prescribed by your doctor.  Continue to use the breathing machine which will help keep your temperature down.  It is common for your temperature to cycle up and down following surgery, especially at night when you are not up moving around and exerting yourself.  The breathing machine keeps your lungs expanded and your temperature down.  RANGE OF MOTION AND STRENGTHENING EXERCISES  Rehabilitation of the knee is important following a knee injury or an operation.  After just a few days of immobilization, the muscles of the thigh which control the knee become weakened and shrink (atrophy). Knee exercises are designed to build up the tone and strength of the thigh muscles and to improve knee motion. Often times heat used for twenty to thirty minutes before working out will loosen up your tissues and help with improving the range of motion but do not use heat for the first two weeks following surgery. These exercises can be done on a  training (exercise) mat, on the floor, on a table or on a bed. Use what ever works the best and is most comfortable for you Knee exercises include:  Leg Lifts - While your knee is still immobilized in a splint or cast, you can do straight leg raises. Lift the leg to 60 degrees, hold for 3 sec, and slowly lower the leg. Repeat 10-20 times 2-3 times daily. Perform this exercise against resistance later as your knee gets better.  Quad and Hamstring Sets - Tighten up the muscle on the front of the thigh (Quad) and hold for 5-10 sec. Repeat this 10-20 times hourly. Hamstring sets are done by pushing the foot backward against an object and holding for 5-10 sec. Repeat as with quad sets.  A rehabilitation program following serious knee injuries can speed recovery and prevent re-injury in the future due to weakened muscles. Contact your doctor or a physical therapist for more information on knee rehabilitation.   SKILLED REHAB INSTRUCTIONS: If the patient is transferred to a skilled rehab facility following release from the hospital, a list of the current medications will be sent to the facility for the patient to continue.  When discharged from the skilled rehab facility, please have the facility set up the patient's [place] prior to being released. Also, the skilled facility will be responsible for providing the patient with their medications at time of release from the facility to include their pain medication, the muscle relaxants, and their blood thinner medication. If the patient is still at the rehab facility at time of the two week follow up appointment, the skilled rehab facility will also need to assist the patient in arranging follow up appointment in our office and any transportation needs.  MAKE SURE YOU:  Understand these instructions.  Will watch your condition.  Will get help right away if you are not doing well or get worse.    Pick up stool softner and laxative for home  use following surgery while on pain medications. Do NOT remove your dressing. You may shower.  Do not take tub baths or submerge incision under water. May shower starting three days after surgery. Please use a clean towel to pat the incision dry following showers. Continue to use ice for pain and swelling after surgery. Do not use any lotions or creams on the incision until instructed by your surgeon.

## 2015-11-25 NOTE — Progress Notes (Signed)
   Subjective:  Patient reports pain as mild to moderate.  Denies N/V/CP/SOB.  Objective:   VITALS:   Filed Vitals:   11/24/15 1051 11/24/15 1335 11/24/15 2100 11/25/15 0500  BP: 110/50 111/73 107/58 114/53  Pulse: 76 72 83 77  Temp: 98.4 F (36.9 C) 98.5 F (36.9 C) 97.7 F (36.5 C) 100 F (37.8 C)  TempSrc: Oral Axillary Oral Oral  Resp: 18 16 18 16   Height:      Weight:      SpO2: 92% 89% 93% 93%    ABD soft Sensation intact distally Intact pulses distally Dorsiflexion/Plantar flexion intact Incision: scant drainage Compartment soft   Lab Results  Component Value Date   WBC 10.2 11/25/2015   HGB 10.8* 11/25/2015   HCT 31.9* 11/25/2015   MCV 93.3 11/25/2015   PLT 143* 11/25/2015   BMET    Component Value Date/Time   NA 140 11/24/2015 0416   K 4.4 11/24/2015 0416   CL 106 11/24/2015 0416   CO2 25 11/24/2015 0416   GLUCOSE 137* 11/24/2015 0416   BUN 19 11/24/2015 0416   CREATININE 0.97 11/24/2015 0416   CREATININE 0.89 11/05/2015 1234   CALCIUM 8.3* 11/24/2015 0416   GFRNONAA >60 11/24/2015 0416   GFRAA >60 11/24/2015 0416     Assessment/Plan: 2 Days Post-Op   Active Problems:   Primary osteoarthritis of left knee   S/P knee replacement   WBAT with walker DVT ppx: lovenox in house - home on ASA, SCDs, TEDS PT/OT PO pain control D/C home with HHPT   Masae Lukacs, Vinicius Gerke 11/25/2015, 6:57 AM   Rod Can, MD Cell [phone]

## 2015-11-25 NOTE — Care Management Note (Addendum)
Case Management Note  Patient Details  Name: Zachary Ball MRN: [id number] Date of Birth: 08-21-1925  Subjective/Objective:      LTKA              Action/Plan: Discharge Planning: AVS reviewed: NCM spoke to pt and wife, Zachary Ball # [phone] at bedside. Address to their home in [place] is [address], [phone]. Wife states she will need ambulance transport home. Offered choice for HH/provided list. A[facility]able to [facility]. Wife states pt has RW and 3n1 at home. Can afford medications at home. Completed ambulance transport form. Explained to wife NCM could not guarantee insurance will cover cost of transport to home.   PCP- Zachary Lor MD   Expected Discharge Date:  11/25/2015                Expected Discharge Plan:  [place]  In-House Referral:  NA  Discharge planning Services  CM Consult  Post Acute Care Choice:  Home Health Choice offered to:  Patient  DME Arranged:  N/A DME Agency:  NA  HH Arranged:  PT HH Agency:  [facility]  Status of Service:  Completed, signed off  Medicare Important Message Given:    Date Medicare IM Given:    Medicare IM give by:    Date Additional Medicare IM Given:    Additional Medicare Important Message give by:     If discussed at [place] of Stay Meetings, dates discussed:    Additional Comments:  Erenest Rasher, RN 11/25/2015, 9:39 AM

## 2015-11-25 NOTE — Progress Notes (Signed)
Physical Therapy Treatment Patient Details Name: Zachary Ball MRN: [id number] DOB: Feb 07, 1925 Today's Date: 11/25/2015    History of Present Illness LTKA    PT Comments    Daughter requesting non emergency transport as they are  Nervous about steps.   Follow Up Recommendations  Home health PT;Supervision/Assistance - 24 hour     Equipment Recommendations       Recommendations for Other Services       Precautions / Restrictions Precautions Precautions: Fall Required Braces or Orthoses: Knee Immobilizer - Left Knee Immobilizer - Left: Discontinue once straight leg raise with < 10 degree lag    Mobility  Bed Mobility               General bed mobility comments: pt in chair  Transfers Overall transfer level: Needs assistance Equipment used: Rolling walker (2 wheeled) Transfers: Sit to/from Stand Sit to Stand: Supervision Stand pivot transfers: Supervision       General transfer comment: tends to land hard into chair  Ambulation/Gait Ambulation/Gait assistance: Supervision Ambulation Distance (Feet): 100 Feet (x 2) Assistive device: Rolling walker (2 wheeled) Gait Pattern/deviations: Step-through pattern     General Gait Details: cues for sequence and posture   Stairs Stairs: Yes Stairs assistance: Min assist Stair Management: Forwards;With crutches;One rail Right Number of Stairs: 4 General stair comments: daughter present for instructions. plans to stay on first level until safely negotiates the steps. Sats 88% with activity.   Wheelchair Mobility    Modified Rankin (Stroke Patients Only)       Balance                                    Cognition Arousal/Alertness: Awake/alert Behavior During Therapy: WFL for tasks assessed/performed Overall Cognitive Status: Within Functional Limits for tasks assessed                      Exercises Total Joint Exercises Quad Sets: AROM;Both;10 reps Heel Slides: AAROM;Left;10  reps Hip ABduction/ADduction: AAROM;Left;10 reps Straight Leg Raises: AAROM;Left;10 reps Goniometric ROM: 10-70 L knee    General Comments        Pertinent Vitals/Pain Pain Score: 2  Pain Location: L knee and thigh Pain Descriptors / Indicators: Tender Pain Intervention(s): Premedicated before session;Repositioned    Home Living                      Prior Function            PT Goals (current goals can now be found in the care plan section) Progress towards PT goals: Progressing toward goals    Frequency       PT Plan Current plan remains appropriate    Co-evaluation             End of Session   Activity Tolerance: Patient tolerated treatment well Patient left: in chair;with call bell/phone within reach;with family/visitor present     Time: [id number] PT Time Calculation (min) (ACUTE ONLY): 45 min  Charges:  $Gait Training: 8-22 mins $Therapeutic Exercise: 8-22 mins $Self Care/Home Management: 04/22/23                    G Codes:      Claretha Cooper 11/25/2015, 1:37 PM

## 2015-11-26 ENCOUNTER — Encounter (HOSPITAL_COMMUNITY): Payer: Self-pay | Admitting: Specialist

## 2015-11-26 DIAGNOSIS — I447 Left bundle-branch block, unspecified: Secondary | ICD-10-CM | POA: Diagnosis not present

## 2015-11-26 DIAGNOSIS — Z96652 Presence of left artificial knee joint: Secondary | ICD-10-CM | POA: Diagnosis not present

## 2015-11-26 DIAGNOSIS — Z471 Aftercare following joint replacement surgery: Secondary | ICD-10-CM | POA: Diagnosis not present

## 2015-11-26 DIAGNOSIS — M1990 Unspecified osteoarthritis, unspecified site: Secondary | ICD-10-CM | POA: Diagnosis not present

## 2015-11-26 DIAGNOSIS — I350 Nonrheumatic aortic (valve) stenosis: Secondary | ICD-10-CM | POA: Diagnosis not present

## 2015-11-26 DIAGNOSIS — I2510 Atherosclerotic heart disease of native coronary artery without angina pectoris: Secondary | ICD-10-CM | POA: Diagnosis not present

## 2015-11-27 DIAGNOSIS — M1990 Unspecified osteoarthritis, unspecified site: Secondary | ICD-10-CM | POA: Diagnosis not present

## 2015-11-27 DIAGNOSIS — Z471 Aftercare following joint replacement surgery: Secondary | ICD-10-CM | POA: Diagnosis not present

## 2015-11-27 DIAGNOSIS — I447 Left bundle-branch block, unspecified: Secondary | ICD-10-CM | POA: Diagnosis not present

## 2015-11-27 DIAGNOSIS — Z96652 Presence of left artificial knee joint: Secondary | ICD-10-CM | POA: Diagnosis not present

## 2015-11-27 DIAGNOSIS — I2510 Atherosclerotic heart disease of native coronary artery without angina pectoris: Secondary | ICD-10-CM | POA: Diagnosis not present

## 2015-11-27 DIAGNOSIS — I350 Nonrheumatic aortic (valve) stenosis: Secondary | ICD-10-CM | POA: Diagnosis not present

## 2015-11-28 DIAGNOSIS — M1990 Unspecified osteoarthritis, unspecified site: Secondary | ICD-10-CM | POA: Diagnosis not present

## 2015-11-28 DIAGNOSIS — Z96652 Presence of left artificial knee joint: Secondary | ICD-10-CM | POA: Diagnosis not present

## 2015-11-28 DIAGNOSIS — I350 Nonrheumatic aortic (valve) stenosis: Secondary | ICD-10-CM | POA: Diagnosis not present

## 2015-11-28 DIAGNOSIS — I2510 Atherosclerotic heart disease of native coronary artery without angina pectoris: Secondary | ICD-10-CM | POA: Diagnosis not present

## 2015-11-28 DIAGNOSIS — I447 Left bundle-branch block, unspecified: Secondary | ICD-10-CM | POA: Diagnosis not present

## 2015-11-28 DIAGNOSIS — Z471 Aftercare following joint replacement surgery: Secondary | ICD-10-CM | POA: Diagnosis not present

## 2015-11-29 ENCOUNTER — Telehealth: Payer: Self-pay | Admitting: *Deleted

## 2015-11-29 ENCOUNTER — Ambulatory Visit: Payer: Medicare Other | Admitting: Podiatry

## 2015-11-29 DIAGNOSIS — I350 Nonrheumatic aortic (valve) stenosis: Secondary | ICD-10-CM | POA: Diagnosis not present

## 2015-11-29 DIAGNOSIS — I447 Left bundle-branch block, unspecified: Secondary | ICD-10-CM | POA: Diagnosis not present

## 2015-11-29 DIAGNOSIS — M1990 Unspecified osteoarthritis, unspecified site: Secondary | ICD-10-CM | POA: Diagnosis not present

## 2015-11-29 DIAGNOSIS — Z96652 Presence of left artificial knee joint: Secondary | ICD-10-CM | POA: Diagnosis not present

## 2015-11-29 DIAGNOSIS — I2510 Atherosclerotic heart disease of native coronary artery without angina pectoris: Secondary | ICD-10-CM | POA: Diagnosis not present

## 2015-11-29 DIAGNOSIS — Z471 Aftercare following joint replacement surgery: Secondary | ICD-10-CM | POA: Diagnosis not present

## 2015-11-29 NOTE — Telephone Encounter (Signed)
Patients wife, [name] left a message on the refill voicemail stating that the patient was started on furosemide by Dr Oval Linsey. She has not been giving him this medication anymore because it was causing him to go to the bathroom 4-5 times a night and also she reports that his ankles look better. [name] can be reached at [phone]. Please advise. Thanks, MI

## 2015-11-30 DIAGNOSIS — I447 Left bundle-branch block, unspecified: Secondary | ICD-10-CM | POA: Diagnosis not present

## 2015-11-30 DIAGNOSIS — Z96652 Presence of left artificial knee joint: Secondary | ICD-10-CM | POA: Diagnosis not present

## 2015-11-30 DIAGNOSIS — Z471 Aftercare following joint replacement surgery: Secondary | ICD-10-CM | POA: Diagnosis not present

## 2015-11-30 DIAGNOSIS — I350 Nonrheumatic aortic (valve) stenosis: Secondary | ICD-10-CM | POA: Diagnosis not present

## 2015-11-30 DIAGNOSIS — M1990 Unspecified osteoarthritis, unspecified site: Secondary | ICD-10-CM | POA: Diagnosis not present

## 2015-11-30 DIAGNOSIS — I2510 Atherosclerotic heart disease of native coronary artery without angina pectoris: Secondary | ICD-10-CM | POA: Diagnosis not present

## 2015-11-30 NOTE — Telephone Encounter (Signed)
No answer

## 2015-12-03 DIAGNOSIS — Z4789 Encounter for other orthopedic aftercare: Secondary | ICD-10-CM | POA: Diagnosis not present

## 2015-12-03 DIAGNOSIS — M1712 Unilateral primary osteoarthritis, left knee: Secondary | ICD-10-CM | POA: Diagnosis not present

## 2015-12-04 DIAGNOSIS — I2510 Atherosclerotic heart disease of native coronary artery without angina pectoris: Secondary | ICD-10-CM | POA: Diagnosis not present

## 2015-12-04 DIAGNOSIS — I350 Nonrheumatic aortic (valve) stenosis: Secondary | ICD-10-CM | POA: Diagnosis not present

## 2015-12-04 DIAGNOSIS — Z471 Aftercare following joint replacement surgery: Secondary | ICD-10-CM | POA: Diagnosis not present

## 2015-12-04 DIAGNOSIS — Z96652 Presence of left artificial knee joint: Secondary | ICD-10-CM | POA: Diagnosis not present

## 2015-12-04 DIAGNOSIS — I447 Left bundle-branch block, unspecified: Secondary | ICD-10-CM | POA: Diagnosis not present

## 2015-12-04 DIAGNOSIS — M1990 Unspecified osteoarthritis, unspecified site: Secondary | ICD-10-CM | POA: Diagnosis not present

## 2015-12-04 NOTE — Telephone Encounter (Signed)
Spoke with wife and they saw a PA who advised to have him continue Furosemide so he is taking

## 2015-12-05 DIAGNOSIS — M7989 Other specified soft tissue disorders: Secondary | ICD-10-CM | POA: Diagnosis not present

## 2015-12-05 DIAGNOSIS — R2242 Localized swelling, mass and lump, left lower limb: Secondary | ICD-10-CM | POA: Diagnosis not present

## 2015-12-05 DIAGNOSIS — Z96652 Presence of left artificial knee joint: Secondary | ICD-10-CM | POA: Diagnosis not present

## 2015-12-05 DIAGNOSIS — Z951 Presence of aortocoronary bypass graft: Secondary | ICD-10-CM | POA: Diagnosis not present

## 2015-12-05 DIAGNOSIS — I447 Left bundle-branch block, unspecified: Secondary | ICD-10-CM | POA: Diagnosis not present

## 2015-12-05 DIAGNOSIS — M1990 Unspecified osteoarthritis, unspecified site: Secondary | ICD-10-CM | POA: Diagnosis not present

## 2015-12-05 DIAGNOSIS — Z79899 Other long term (current) drug therapy: Secondary | ICD-10-CM | POA: Diagnosis not present

## 2015-12-05 DIAGNOSIS — I350 Nonrheumatic aortic (valve) stenosis: Secondary | ICD-10-CM | POA: Diagnosis not present

## 2015-12-05 DIAGNOSIS — E079 Disorder of thyroid, unspecified: Secondary | ICD-10-CM | POA: Diagnosis not present

## 2015-12-05 DIAGNOSIS — I2510 Atherosclerotic heart disease of native coronary artery without angina pectoris: Secondary | ICD-10-CM | POA: Diagnosis not present

## 2015-12-05 DIAGNOSIS — Z471 Aftercare following joint replacement surgery: Secondary | ICD-10-CM | POA: Diagnosis not present

## 2015-12-05 DIAGNOSIS — Z7982 Long term (current) use of aspirin: Secondary | ICD-10-CM | POA: Diagnosis not present

## 2015-12-05 NOTE — Discharge Summary (Signed)
Physician Discharge Summary  Patient ID: Zachary Ball MRN: [id number] DOB/AGE: [birth year] 82 y.o.  Admit date: 11/23/2015 Discharge date: 12/05/2015  Admission Diagnoses: knee OA  Discharge Diagnoses:  Active Problems:   Primary osteoarthritis of left knee   S/P knee replacement   Discharged Condition: good  Hospital Course:  Zachary Ball is a 82 y.o. who was admitted to [facility]. They were brought to the operating room on 11/23/2015 and underwent Procedure(s): LEFT TOTAL KNEE ARTHROPLASTY.  Patient tolerated the procedure well and was later transferred to the recovery room and then to the orthopaedic floor for postoperative care.  They were given PO and IV analgesics for pain control following their surgery.  They were given 24 hours of postoperative antibiotics of  Anti-infectives    Start     Dose/Rate Route Frequency Ordered Stop   11/24/15 0600  ceFAZolin (ANCEF) 2 g in dextrose 5 % 50 mL IVPB     2 g 140 mL/hr over 30 Minutes Intravenous On call to O.R. 11/23/15 1445 11/23/15 1323   11/23/15 2000  ceFAZolin (ANCEF) IVPB 2g/100 mL premix     2 g 200 mL/hr over 30 Minutes Intravenous Every 6 hours 11/23/15 1715 11/24/15 0223     and started on DVT prophylaxis in the form of lovenox.   PT and OT were ordered for total joint protocol.  Discharge planning consulted to help with postop disposition and equipment needs.  Patient had a good night on the evening of surgery and started to get up OOB with therapy on day one.  Hemovac drain was pulled without difficulty.  Continued to work with therapy into day two.  Dressing was with normal limits.  The patient had progressed with therapy and meeting their goals. Patient was seen in rounds and was ready to go home.  Consults: n/a  Significant Diagnostic Studies: routine  Treatments: routine  Discharge Exam: Blood pressure 114/53, pulse 77, temperature 100 F (37.8 C), temperature source Oral, resp. rate 16, height 6'  (1.829 m), weight 99.791 kg (220 lb), SpO2 93 %. Alert and oriented x3. RRR, Lungs clear, BS x4. Left Calf soft and non tender. L knee dressing C/D/I. No DVT signs. No signs of infection or compartment syndrome. LLE grossly neurovascularly intact.   Disposition: 06-Home-Health Care Svc  Discharge Instructions    Call MD / Call 911    Complete by:  As directed   If you experience chest pain or shortness of breath, CALL 911 and be transported to the hospital emergency room.  If you develope a fever above 101 F, pus (white drainage) or increased drainage or redness at the wound, or calf pain, call your surgeon's office.     Call MD / Call 911    Complete by:  As directed   If you experience chest pain or shortness of breath, CALL 911 and be transported to the hospital emergency room.  If you develope a fever above 101 F, pus (white drainage) or increased drainage or redness at the wound, or calf pain, call your surgeon's office.     Constipation Prevention    Complete by:  As directed   Drink plenty of fluids.  Prune juice may be helpful.  You may use a stool softener, such as Colace (over the counter) 100 mg twice a day.  Use MiraLax (over the counter) for constipation as needed.     Constipation Prevention    Complete by:  As directed   Drink  plenty of fluids.  Prune juice may be helpful.  You may use a stool softener, such as Colace (over the counter) 100 mg twice a day.  Use MiraLax (over the counter) for constipation as needed.     Diet - low sodium heart healthy    Complete by:  As directed      Diet - low sodium heart healthy    Complete by:  As directed      Discharge instructions    Complete by:  As directed   INSTRUCTIONS AFTER JOINT REPLACEMENT   Remove items at home which could result in a fall. This includes throw rugs or furniture in walking pathways ICE to the affected joint every three hours while awake for 30 minutes at a time, for at least the first 3-5 days, and then as  needed for pain and swelling.  Continue to use ice for pain and swelling. You may notice swelling that will progress down to the foot and ankle.  This is normal after surgery.  Elevate your leg when you are not up walking on it.   Continue to use the breathing machine you got in the hospital (incentive spirometer) which will help keep your temperature down.  It is common for your temperature to cycle up and down following surgery, especially at night when you are not up moving around and exerting yourself.  The breathing machine keeps your lungs expanded and your temperature down.   DIET:  As you were doing prior to hospitalization, we recommend a well-balanced diet.  DRESSING / WOUND CARE / SHOWERING  Keep the surgical dressing until follow up.  The dressing is water proof, so you can shower without any extra covering.  IF THE DRESSING FALLS OFF or the wound gets wet inside, change the dressing with sterile gauze.  Please use good hand washing techniques before changing the dressing.  Do not use any lotions or creams on the incision until instructed by your surgeon.    ACTIVITY  Increase activity slowly as tolerated, but follow the weight bearing instructions below.   No driving for 6 weeks or until further direction given by your physician.  You cannot drive while taking narcotics.  No lifting or carrying greater than 10 lbs. until further directed by your surgeon. Avoid periods of inactivity such as sitting longer than an hour when not asleep. This helps prevent blood clots.  You may return to work once you are authorized by your doctor.     WEIGHT BEARING   Weight bearing as tolerated with assist device (walker, cane, etc) as directed, use it as long as suggested by your surgeon or therapist, typically at least 4-6 weeks.   EXERCISES  Results after joint replacement surgery are often greatly improved when you follow the exercise, range of motion and muscle strengthening exercises  prescribed by your doctor. Safety measures are also important to protect the joint from further injury. Any time any of these exercises cause you to have increased pain or swelling, decrease what you are doing until you are comfortable again and then slowly increase them. If you have problems or questions, call your caregiver or physical therapist for advice.   Rehabilitation is important following a joint replacement. After just a few days of immobilization, the muscles of the leg can become weakened and shrink (atrophy).  These exercises are designed to build up the tone and strength of the thigh and leg muscles and to improve motion. Often times heat used for  twenty to thirty minutes before working out will loosen up your tissues and help with improving the range of motion but do not use heat for the first two weeks following surgery (sometimes heat can increase post-operative swelling).   These exercises can be done on a training (exercise) mat, on the floor, on a table or on a bed. Use whatever works the best and is most comfortable for you.    Use music or television while you are exercising so that the exercises are a pleasant break in your day. This will make your life better with the exercises acting as a break in your routine that you can look forward to.   Perform all exercises about fifteen times, three times per day or as directed.  You should exercise both the operative leg and the other leg as well.   Exercises include:   Quad Sets - Tighten up the muscle on the front of the thigh (Quad) and hold for 5-10 seconds.   Straight Leg Raises - With your knee straight (if you were given a brace, keep it on), lift the leg to 60 degrees, hold for 3 seconds, and slowly lower the leg.  Perform this exercise against resistance later as your leg gets stronger.  Leg Slides: Lying on your back, slowly slide your foot toward your buttocks, bending your knee up off the floor (only go as far as is  comfortable). Then slowly slide your foot back down until your leg is flat on the floor again.  Angel Wings: Lying on your back spread your legs to the side as far apart as you can without causing discomfort.  Hamstring Strength:  Lying on your back, push your heel against the floor with your leg straight by tightening up the muscles of your buttocks.  Repeat, but this time bend your knee to a comfortable angle, and push your heel against the floor.  You may put a pillow under the heel to make it more comfortable if necessary.   A rehabilitation program following joint replacement surgery can speed recovery and prevent re-injury in the future due to weakened muscles. Contact your doctor or a physical therapist for more information on knee rehabilitation.    CONSTIPATION  Constipation is defined medically as fewer than three stools per week and severe constipation as less than one stool per week.  Even if you have a regular bowel pattern at home, your normal regimen is likely to be disrupted due to multiple reasons following surgery.  Combination of anesthesia, postoperative narcotics, change in appetite and fluid intake all can affect your bowels.   YOU MUST use at least one of the following options; they are listed in order of increasing strength to get the job done.  They are all available over the counter, and you may need to use some, POSSIBLY even all of these options:    Drink plenty of fluids (prune juice may be helpful) and high fiber foods Colace 100 mg by mouth twice a day  Senokot for constipation as directed and as needed Dulcolax (bisacodyl), take with full glass of water  Miralax (polyethylene glycol) once or twice a day as needed.  If you have tried all these things and are unable to have a bowel movement in the first 3-4 days after surgery call either your surgeon or your primary doctor.    If you experience loose stools or diarrhea, hold the medications until you stool forms back  up.  If your symptoms do  not get better within 1 week or if they get worse, check with your doctor.  If you experience "the worst abdominal pain ever" or develop nausea or vomiting, please contact the office immediately for further recommendations for treatment.   ITCHING:  If you experience itching with your medications, try taking only a single pain pill, or even half a pain pill at a time.  You can also use Benadryl over the counter for itching or also to help with sleep.   TED HOSE STOCKINGS:  Use stockings on both legs until for at least 2 weeks or as directed by physician office. They may be removed at night for sleeping.  MEDICATIONS:  See your medication summary on the "After Visit Summary" that nursing will review with you.  You may have some home medications which will be placed on hold until you complete the course of blood thinner medication.  It is important for you to complete the blood thinner medication as prescribed.  PRECAUTIONS:  If you experience chest pain or shortness of breath - call 911 immediately for transfer to the hospital emergency department.   If you develop a fever greater that 101 F, purulent drainage from wound, increased redness or drainage from wound, foul odor from the wound/dressing, or calf pain - CONTACT YOUR SURGEON.                                                   FOLLOW-UP APPOINTMENTS:  If you do not already have a post-op appointment, please call the office for an appointment to be seen by your surgeon.  Guidelines for how soon to be seen are listed in your "After Visit Summary", but are typically between 1-4 weeks after surgery.  OTHER INSTRUCTIONS:   Knee Replacement:  Do not place pillow under knee, focus on keeping the knee straight while resting. CPM instructions: 0-90 degrees, 2 hours in the morning, 2 hours in the afternoon, and 2 hours in the evening. Place foam block, curve side up under heel at all times except when in CPM or when walking.  DO  NOT modify, tear, cut, or change the foam block in any way.  MAKE SURE YOU:  Understand these instructions.  Get help right away if you are not doing well or get worse.    Thank you for letting us be a part of your medical care team.  It is a privilege we respect greatly.  We hope these instructions will help you stay on track for a fast and full recovery!     Do not put a pillow under the knee. Place it under the heel.    Complete by:  As directed      Driving restrictions    Complete by:  As directed   No driving for 6 weeks     Increase activity slowly as tolerated    Complete by:  As directed      Increase activity slowly as tolerated    Complete by:  As directed      Lifting restrictions    Complete by:  As directed   No lifting for 6 weeks     TED hose    Complete by:  As directed   Use stockings (TED hose) for 2 weeks on both leg(s).  You may remove them at night for sleeping.  Medication List    STOP taking these medications        aspirin 81 MG tablet  Replaced by:  aspirin EC 325 MG tablet     traMADol 50 MG tablet  Commonly known as:  ULTRAM      TAKE these medications        albuterol 108 (90 Base) MCG/ACT inhaler  Commonly known as:  PROVENTIL HFA;VENTOLIN HFA  Inhale into the lungs every 6 (six) hours as needed for wheezing or shortness of breath.     aspirin EC 325 MG tablet  Take 1 tablet (325 mg total) by mouth 2 (two) times daily.     atorvastatin 40 MG tablet  Commonly known as:  LIPITOR  Take 40 mg by mouth daily.     baclofen 10 MG tablet  Commonly known as:  LIORESAL  Take 1 tablet (10 mg total) by mouth 3 (three) times daily.     clotrimazole-betamethasone cream  Commonly known as:  LOTRISONE  Apply 1 application topically 2 (two) times daily.     furosemide 20 MG tablet  Commonly known as:  LASIX  Take 1 tablet (20 mg total) by mouth daily.     GLUCOSAMINE CHONDR COMPLEX PO  Take 1 tablet by mouth daily.     ICAPS MV  PO  Take 1 capsule by mouth daily.     levothyroxine 25 MCG tablet  Commonly known as:  SYNTHROID, LEVOTHROID  Take 1 tablet (25 mcg total) by mouth daily before breakfast.     multivitamin tablet  Take 1 tablet by mouth daily.     oxyCODONE-acetaminophen 5-325 MG tablet  Commonly known as:  ROXICET  Take 1-2 tablets by mouth every 4 (four) hours as needed for severe pain.     polyethylene glycol packet  Commonly known as:  MIRALAX / GLYCOLAX  Take 17 g by mouth daily.           Follow-up Information    Follow up with Cynda Familia, MD. Schedule an appointment as soon as possible for a visit in 2 weeks.   Specialty:  Orthopedic Surgery   Why:  For wound re-check   Contact information:   [address] [place] [address] [phone]       Follow up with [facility].   Why:  Home Health Physical Therapy   Contact information:   [place] [address] [phone]       Signed: Lajean Manes 12/05/2015, 11:43 PM

## 2015-12-07 DIAGNOSIS — M1990 Unspecified osteoarthritis, unspecified site: Secondary | ICD-10-CM | POA: Diagnosis not present

## 2015-12-07 DIAGNOSIS — Z96652 Presence of left artificial knee joint: Secondary | ICD-10-CM | POA: Diagnosis not present

## 2015-12-07 DIAGNOSIS — I2510 Atherosclerotic heart disease of native coronary artery without angina pectoris: Secondary | ICD-10-CM | POA: Diagnosis not present

## 2015-12-07 DIAGNOSIS — Z471 Aftercare following joint replacement surgery: Secondary | ICD-10-CM | POA: Diagnosis not present

## 2015-12-07 DIAGNOSIS — I350 Nonrheumatic aortic (valve) stenosis: Secondary | ICD-10-CM | POA: Diagnosis not present

## 2015-12-07 DIAGNOSIS — I447 Left bundle-branch block, unspecified: Secondary | ICD-10-CM | POA: Diagnosis not present

## 2015-12-10 DIAGNOSIS — Z96652 Presence of left artificial knee joint: Secondary | ICD-10-CM | POA: Diagnosis not present

## 2015-12-10 DIAGNOSIS — Z471 Aftercare following joint replacement surgery: Secondary | ICD-10-CM | POA: Diagnosis not present

## 2015-12-12 DIAGNOSIS — Z471 Aftercare following joint replacement surgery: Secondary | ICD-10-CM | POA: Diagnosis not present

## 2015-12-12 DIAGNOSIS — R600 Localized edema: Secondary | ICD-10-CM | POA: Diagnosis not present

## 2015-12-12 DIAGNOSIS — R262 Difficulty in walking, not elsewhere classified: Secondary | ICD-10-CM | POA: Diagnosis not present

## 2015-12-12 DIAGNOSIS — M2560 Stiffness of unspecified joint, not elsewhere classified: Secondary | ICD-10-CM | POA: Diagnosis not present

## 2015-12-13 DIAGNOSIS — Z471 Aftercare following joint replacement surgery: Secondary | ICD-10-CM | POA: Diagnosis not present

## 2015-12-13 DIAGNOSIS — R262 Difficulty in walking, not elsewhere classified: Secondary | ICD-10-CM | POA: Diagnosis not present

## 2015-12-13 DIAGNOSIS — R600 Localized edema: Secondary | ICD-10-CM | POA: Diagnosis not present

## 2015-12-13 DIAGNOSIS — M2560 Stiffness of unspecified joint, not elsewhere classified: Secondary | ICD-10-CM | POA: Diagnosis not present

## 2015-12-17 DIAGNOSIS — M2560 Stiffness of unspecified joint, not elsewhere classified: Secondary | ICD-10-CM | POA: Diagnosis not present

## 2015-12-17 DIAGNOSIS — R600 Localized edema: Secondary | ICD-10-CM | POA: Diagnosis not present

## 2015-12-17 DIAGNOSIS — Z471 Aftercare following joint replacement surgery: Secondary | ICD-10-CM | POA: Diagnosis not present

## 2015-12-17 DIAGNOSIS — R262 Difficulty in walking, not elsewhere classified: Secondary | ICD-10-CM | POA: Diagnosis not present

## 2015-12-19 DIAGNOSIS — Z471 Aftercare following joint replacement surgery: Secondary | ICD-10-CM | POA: Diagnosis not present

## 2015-12-19 DIAGNOSIS — R600 Localized edema: Secondary | ICD-10-CM | POA: Diagnosis not present

## 2015-12-19 DIAGNOSIS — R262 Difficulty in walking, not elsewhere classified: Secondary | ICD-10-CM | POA: Diagnosis not present

## 2015-12-19 DIAGNOSIS — M2560 Stiffness of unspecified joint, not elsewhere classified: Secondary | ICD-10-CM | POA: Diagnosis not present

## 2015-12-21 DIAGNOSIS — R262 Difficulty in walking, not elsewhere classified: Secondary | ICD-10-CM | POA: Diagnosis not present

## 2015-12-21 DIAGNOSIS — M2560 Stiffness of unspecified joint, not elsewhere classified: Secondary | ICD-10-CM | POA: Diagnosis not present

## 2015-12-21 DIAGNOSIS — Z471 Aftercare following joint replacement surgery: Secondary | ICD-10-CM | POA: Diagnosis not present

## 2015-12-21 DIAGNOSIS — R600 Localized edema: Secondary | ICD-10-CM | POA: Diagnosis not present

## 2015-12-24 DIAGNOSIS — M1712 Unilateral primary osteoarthritis, left knee: Secondary | ICD-10-CM | POA: Diagnosis not present

## 2015-12-28 DIAGNOSIS — M1712 Unilateral primary osteoarthritis, left knee: Secondary | ICD-10-CM | POA: Diagnosis not present

## 2015-12-31 DIAGNOSIS — M1712 Unilateral primary osteoarthritis, left knee: Secondary | ICD-10-CM | POA: Diagnosis not present

## 2016-01-03 DIAGNOSIS — M1712 Unilateral primary osteoarthritis, left knee: Secondary | ICD-10-CM | POA: Diagnosis not present

## 2016-01-07 DIAGNOSIS — M1712 Unilateral primary osteoarthritis, left knee: Secondary | ICD-10-CM | POA: Diagnosis not present

## 2016-01-10 DIAGNOSIS — M1712 Unilateral primary osteoarthritis, left knee: Secondary | ICD-10-CM | POA: Diagnosis not present

## 2016-01-10 DIAGNOSIS — H04121 Dry eye syndrome of right lacrimal gland: Secondary | ICD-10-CM | POA: Diagnosis not present

## 2016-01-14 DIAGNOSIS — Z96652 Presence of left artificial knee joint: Secondary | ICD-10-CM | POA: Diagnosis not present

## 2016-01-14 DIAGNOSIS — Z471 Aftercare following joint replacement surgery: Secondary | ICD-10-CM | POA: Diagnosis not present

## 2016-01-17 DIAGNOSIS — M1712 Unilateral primary osteoarthritis, left knee: Secondary | ICD-10-CM | POA: Diagnosis not present

## 2016-01-22 DIAGNOSIS — M1712 Unilateral primary osteoarthritis, left knee: Secondary | ICD-10-CM | POA: Diagnosis not present

## 2016-01-28 DIAGNOSIS — M1712 Unilateral primary osteoarthritis, left knee: Secondary | ICD-10-CM | POA: Diagnosis not present

## 2016-01-31 DIAGNOSIS — M1712 Unilateral primary osteoarthritis, left knee: Secondary | ICD-10-CM | POA: Diagnosis not present

## 2016-02-04 DIAGNOSIS — M1712 Unilateral primary osteoarthritis, left knee: Secondary | ICD-10-CM | POA: Diagnosis not present

## 2016-02-06 ENCOUNTER — Ambulatory Visit (INDEPENDENT_AMBULATORY_CARE_PROVIDER_SITE_OTHER): Payer: Medicare Other | Admitting: Podiatry

## 2016-02-06 ENCOUNTER — Encounter: Payer: Self-pay | Admitting: Podiatry

## 2016-02-06 DIAGNOSIS — B351 Tinea unguium: Secondary | ICD-10-CM | POA: Diagnosis not present

## 2016-02-06 DIAGNOSIS — M79676 Pain in unspecified toe(s): Secondary | ICD-10-CM

## 2016-02-06 NOTE — Progress Notes (Signed)
Patient ID: Zachary Ball, male   DOB: 01/03/1925, 82 y.o.   MRN: [id number] Complaint:  Visit Type: Patient returns to my office for continued preventative foot care services. Complaint: Patient states" my nails have grown long and thick and become painful to walk and wear shoe. He presents for preventative foot care services. No changes to ROS.  He says he is scheduled for knee surgery soon..  Podiatric Exam: Vascular: dorsalis pedis and posterior tibial pulses are palpable bilateral. Capillary return is immediate. Temperature gradient is WNL. Skin turgor WNL  Sensorium: Normal Semmes Weinstein monofilament test. Normal tactile sensation bilaterally. Nail Exam: Pt has thick disfigured discolored nails with subungual debris noted bilateral entire nail hallux through fifth toenails Ulcer Exam: There is no evidence of ulcer or pre-ulcerative changes or infection. Orthopedic Exam: Muscle tone and strength are WNL. No limitations in general ROM. No crepitus or effusions noted. Foot type and digits show no abnormalities. Bony prominences are unremarkable. Skin: No Porokeratosis. No infection or ulcers  Diagnosis:  Tinea unguium, Pain in right toe, pain in left toes  Treatment & Plan Procedures and Treatment: Consent by patient was obtained for treatment procedures. The patient understood the discussion of treatment and procedures well. All questions were answered thoroughly reviewed. Debridement of mycotic and hypertrophic toenails, 1 through 5 bilateral and clearing of subungual debris. No ulceration, no infection noted.  Return Visit-Office Procedure: Patient instructed to return to the office for a follow up visit 3 months for continued evaluation and treatment.   Tyffani Foglesong DPM 

## 2016-02-07 DIAGNOSIS — M1712 Unilateral primary osteoarthritis, left knee: Secondary | ICD-10-CM | POA: Diagnosis not present

## 2016-02-11 DIAGNOSIS — M1712 Unilateral primary osteoarthritis, left knee: Secondary | ICD-10-CM | POA: Diagnosis not present

## 2016-02-14 DIAGNOSIS — M1712 Unilateral primary osteoarthritis, left knee: Secondary | ICD-10-CM | POA: Diagnosis not present

## 2016-02-18 DIAGNOSIS — Z471 Aftercare following joint replacement surgery: Secondary | ICD-10-CM | POA: Diagnosis not present

## 2016-02-18 DIAGNOSIS — Z96652 Presence of left artificial knee joint: Secondary | ICD-10-CM | POA: Diagnosis not present

## 2016-03-24 DIAGNOSIS — Z471 Aftercare following joint replacement surgery: Secondary | ICD-10-CM | POA: Diagnosis not present

## 2016-03-24 DIAGNOSIS — Z96652 Presence of left artificial knee joint: Secondary | ICD-10-CM | POA: Diagnosis not present

## 2016-04-14 DIAGNOSIS — L218 Other seborrheic dermatitis: Secondary | ICD-10-CM | POA: Diagnosis not present

## 2016-04-14 DIAGNOSIS — L820 Inflamed seborrheic keratosis: Secondary | ICD-10-CM | POA: Diagnosis not present

## 2016-05-07 ENCOUNTER — Encounter: Payer: Self-pay | Admitting: Podiatry

## 2016-05-07 ENCOUNTER — Ambulatory Visit (INDEPENDENT_AMBULATORY_CARE_PROVIDER_SITE_OTHER): Payer: Medicare Other | Admitting: Podiatry

## 2016-05-07 VITALS — Ht 74.5 in | Wt 210.0 lb

## 2016-05-07 DIAGNOSIS — B351 Tinea unguium: Secondary | ICD-10-CM

## 2016-05-07 DIAGNOSIS — M79676 Pain in unspecified toe(s): Secondary | ICD-10-CM | POA: Diagnosis not present

## 2016-05-07 NOTE — Progress Notes (Signed)
Patient ID: Zachary Ball, male   DOB: 12/21/1924, 82 y.o.   MRN: [id number] Complaint:  Visit Type: Patient returns to my office for continued preventative foot care services. Complaint: Patient states" my nails have grown long and thick and become painful to walk and wear shoe. He presents for preventative foot care services. No changes to ROS.  He says he is scheduled for knee surgery soon..  Podiatric Exam: Vascular: dorsalis pedis and posterior tibial pulses are palpable bilateral. Capillary return is immediate. Temperature gradient is WNL. Skin turgor WNL  Sensorium: Normal Semmes Weinstein monofilament test. Normal tactile sensation bilaterally. Nail Exam: Pt has thick disfigured discolored nails with subungual debris noted bilateral entire nail hallux through fifth toenails Ulcer Exam: There is no evidence of ulcer or pre-ulcerative changes or infection. Orthopedic Exam: Muscle tone and strength are WNL. No limitations in general ROM. No crepitus or effusions noted. Foot type and digits show no abnormalities. Bony prominences are unremarkable. Skin: No Porokeratosis. No infection or ulcers  Diagnosis:  Tinea unguium, Pain in right toe, pain in left toes  Treatment & Plan Procedures and Treatment: Consent by patient was obtained for treatment procedures. The patient understood the discussion of treatment and procedures well. All questions were answered thoroughly reviewed. Debridement of mycotic and hypertrophic toenails, 1 through 5 bilateral and clearing of subungual debris. No ulceration, no infection noted.  Return Visit-Office Procedure: Patient instructed to return to the office for a follow up visit 3 months for continued evaluation and treatment.   Mannie Wineland DPM 

## 2016-05-14 ENCOUNTER — Ambulatory Visit: Payer: Medicare Other | Admitting: Cardiovascular Disease

## 2016-05-15 ENCOUNTER — Ambulatory Visit (INDEPENDENT_AMBULATORY_CARE_PROVIDER_SITE_OTHER): Payer: Medicare Other | Admitting: Cardiovascular Disease

## 2016-05-15 ENCOUNTER — Encounter: Payer: Self-pay | Admitting: Cardiovascular Disease

## 2016-05-15 VITALS — BP 132/74 | HR 70 | Ht 72.0 in | Wt 218.8 lb

## 2016-05-15 DIAGNOSIS — E785 Hyperlipidemia, unspecified: Secondary | ICD-10-CM | POA: Diagnosis not present

## 2016-05-15 DIAGNOSIS — I493 Ventricular premature depolarization: Secondary | ICD-10-CM

## 2016-05-15 DIAGNOSIS — R609 Edema, unspecified: Secondary | ICD-10-CM | POA: Diagnosis not present

## 2016-05-15 DIAGNOSIS — I5032 Chronic diastolic (congestive) heart failure: Secondary | ICD-10-CM | POA: Diagnosis not present

## 2016-05-15 DIAGNOSIS — I350 Nonrheumatic aortic (valve) stenosis: Secondary | ICD-10-CM

## 2016-05-15 DIAGNOSIS — I6523 Occlusion and stenosis of bilateral carotid arteries: Secondary | ICD-10-CM | POA: Diagnosis not present

## 2016-05-15 LAB — BASIC METABOLIC PANEL
BUN: 19 mg/dL (ref 7–25)
CALCIUM: 8.6 mg/dL (ref 8.6–10.3)
CHLORIDE: 108 mmol/L (ref 98–110)
CO2: 24 mmol/L (ref 20–31)
Creat: 1.02 mg/dL (ref 0.70–1.11)
Glucose, Bld: 82 mg/dL (ref 65–99)
POTASSIUM: 4.4 mmol/L (ref 3.5–5.3)
SODIUM: 143 mmol/L (ref 135–146)

## 2016-05-15 LAB — MAGNESIUM: Magnesium: 2.1 mg/dL (ref 1.5–2.5)

## 2016-05-15 NOTE — Patient Instructions (Signed)
Medication Instructions:  Your physician recommends that you continue on your current medications as directed. Please refer to the Current Medication list given to you today.  Labwork: Bmet/magnesium at [facility] lab on the first floor  Testing/Procedures: none  Follow-Up: Your physician wants you to follow-up in: 6 month ov You will receive a reminder letter in the mail two months in advance. If you don't receive a letter, please call our office to schedule the follow-up appointment.  If you need a refill on your cardiac medications before your next appointment, please call your pharmacy.

## 2016-05-15 NOTE — Progress Notes (Signed)
Cardiology Office Note   Date:  05/15/2016   ID:  Zachary Ball, DOB 1924/10/09, MRN [id number]  PCP:  Zachary Cowden, MD  Cardiologist:   Zachary Latch, MD   No chief complaint on file.     History of Present Illness: Zachary Ball is a 82 y.o. male with CAD s/p CABG (4 vessel, 2003), moderate aortic stenosis, carotid stenosis, and hyperlipidemia who presents for follow up.  Zachary Ball was previously a patient of Zachary Ball.  He last saw Zachary Ball 03/2011. At that appointment he was referred for treadmill stress testing in order to provide a prescription for exercise and to risk stratify.   Zachary Ball had a Lexiscan Myoview in 2012 that revealed LVEF 65% and no ischemia.  He was seen in clinic 09/2015 for preoperative clearance prior to knee surgery. At the time he was exercising twice per week without chest pain or shortness of breath. It was felt that he did not need any ischemia evaluation, but he did have a murmur on exam.  He had an echo 10/01/15 that revealed moderate aortic stenosis and severe LVH.  He was started on lasix 20 mg po daily.  Carotid ultrasound revealed mild plaque bilaterally.  He underwent knee replacement 11/2015.  [name] is been doing well since his last appointment. He notes some mild swelling in his legs that is slightly worse in his left. He attributes this to his knee replacement. He has not noted any orthopnea or PND. The swelling is worse after he eats a salty meal. He denies any chest pain or shortness of breath.  A few weeks ago he started back exercising at [employer].  He rides an exercise bike and lifts weights for an hour without symptoms.  He hasn't noted any palpitations, lightheadedness, or dizziness.  Past Medical History:  Diagnosis Date  . Anal fissure   . AORTIC STENOSIS, MILD 04/08/2007  . Aortic stenosis, moderate 11/05/2015  . ARTHROSCOPY,  KNEE, HX OF 01/07/2007  . BENIGN PROSTATIC HYPERTROPHY 01/07/2007  . CAROTID STENOSIS 03/19/2009  . Cataract    surgery bilateral  . CORONARY ARTERY DISEASE 01/07/2007   s/p CABG 02/2002  . CORONARY ATHEROSCLEROSIS NATIVE CORONARY ARTERY 03/28/2009  . GERD 01/07/2007  . Hx of colonoscopy   . HYPERLIPIDEMIA 07/02/2007  . Hypothyroidism   . LBBB (left bundle branch block) 10/01/2015  . Mild aortic stenosis   . Moderate aortic stenosis 11/05/2015  . Osteoarth NOS-Unspec 01/07/2007  . Pneumonia    Hx of     Past Surgical History:  Procedure Laterality Date  . COLONOSCOPY    . CORONARY ARTERY BYPASS GRAFT  2003   x 4  . KNEE SURGERY     right; 1994 and 1999  . [place]   right  . TOTAL KNEE ARTHROPLASTY Left 11/23/2015   Procedure: LEFT TOTAL KNEE ARTHROPLASTY;  Surgeon: Zachary Cabal, MD;  Location: [facility];  Service: Orthopedics;  Laterality: Left;     Current Outpatient Prescriptions  Medication Sig Dispense Refill  . aspirin 325 MG EC tablet Take 325 mg by mouth daily. Take 1 by mouth on Mon, Wed, and Fri    . levothyroxine (SYNTHROID, LEVOTHROID) 25 MCG tablet Take 1 tablet (25 mcg total) by mouth daily before breakfast. 90 tablet 1  . Multiple Vitamin (MULTIVITAMIN) tablet Take 1 tablet by mouth daily.      . polyethylene glycol (MIRALAX / GLYCOLAX) packet Take 17 g by mouth daily.     Marland Kitchen  atorvastatin (LIPITOR) 40 MG tablet Take 40 mg by mouth daily.    . clotrimazole-betamethasone (LOTRISONE) cream Apply 1 application topically 2 (two) times daily. 30 g 0  . Glucosamine-Chondroitin (GLUCOSAMINE CHONDR COMPLEX PO) Take 1 tablet by mouth daily.      No current facility-administered medications for this visit.     Allergies:   Review of patient's allergies indicates no known allergies.    Social History:  The patient  reports that he quit smoking about 37 years ago. He has never used smokeless tobacco. He reports that he does not drink alcohol or use drugs.   Family  History:  The patient's family history includes Breast cancer in his mother; Colon cancer in his mother; Coronary artery disease in his father; Diabetes in his brother; Kidney disease in his brother; Stomach cancer in his mother.    ROS:  Please see the history of present illness.  Otherwise, review of systems are positive for leg cramping.   All other systems are reviewed and negative.    PHYSICAL EXAM: VS:  BP 132/74   Pulse 70   Ht 6' (1.829 m)   Wt 218 lb 12.8 oz (99.2 kg)   BMI 29.67 kg/m  , BMI Body mass index is 29.67 kg/m. GENERAL:  Well appearing HEENT:  Pupils equal round and reactive, fundi not visualized, oral mucosa unremarkable NECK:  No jugular venous distention, waveform within normal limits, carotid upstroke brisk and symmetric, no bruits LYMPHATICS:  No cervical adenopathy LUNGS:  Clear to auscultation bilaterally HEART:  Mostly regular with occasional ectopy.  PMI not displaced or sustained,S1 and S2 within normal limits, no S3, no S4, no clicks, no rubs, III/VI mid-peaking systolic murmur at the LUSB. ABD:  Flat, positive bowel sounds normal in frequency in pitch, no bruits, no rebound, no guarding, no midline pulsatile mass, no hepatomegaly, no splenomegaly EXT:  2 plus pulses throughout, 1+ pitting edema to L lower tibia, no cyanosis no clubbing SKIN:  No rashes no nodules NEURO:  Cranial nerves II through XII grossly intact, motor grossly intact throughout PSYCH:  Cognitively intact, oriented to person place and time   EKG:  EKG is ordered today. 05/15/16: Sinus rhythm. Rate 70 bpm. Ventricular trigeminy. First degree AV block. The ekg ordered 11/05/15 demonstrates sinus rhythm.  Rate 76 bpm.  Non-specific ST-T changes.  Echo 10/01/15: Study Conclusions  - Left ventricle: Abnormal septal motion The cavity size was mildly  dilated. Wall thickness was increased in a pattern of severe LVH.  Systolic function was normal. The estimated ejection fraction was  in  the range of 50% to 55%. - Aortic valve: There was moderate to severe stenosis. There was  mild regurgitation. Valve area (VTI): 1.02 cm^2. Valve area  (Vmax): 0.95 cm^2. Valve area (Vmean): 0.92 cm^2. - Mitral valve: There was mild regurgitation. - Left atrium: The atrium was mildly dilated. - Atrial septum: No defect or patent foramen ovale was identified. Grade 1 diastolic dysfunction  Lexiscan Myoview 05/21/11: Normal stress nuclear study. No evidence of ischemia. Normal LV function. EF = 65%.  Recent Labs: 09/24/2015: ALT 13; TSH 4.16 11/24/2015: BUN 19; Creatinine, Ser 0.97; Potassium 4.4; Sodium 140 11/25/2015: Hemoglobin 10.8; Platelets 143    Lipid Panel    Component Value Date/Time   CHOL 126 09/24/2015 1000   TRIG 84.0 09/24/2015 1000   HDL 52.90 09/24/2015 1000   CHOLHDL 2 09/24/2015 1000   VLDL 16.8 09/24/2015 1000   LDLCALC 56 09/24/2015 1000  Wt Readings from Last 3 Encounters:  05/15/16 218 lb 12.8 oz (99.2 kg)  05/07/16 210 lb (95.3 kg)  11/23/15 220 lb (99.8 kg)    ASSESSMENT AND PLAN:  # PVCs: Mr. Muhl is noted to be in ventricular trigeminy on exam today. He is completely asymptomatic. Given that he exercises regularly and has no symptoms, ischemia seems unlikely. We will check a basic metabolic panel and a magnesium today.  # Aortic stenosis:  Moderate aortic stenosis with a mean gradient of 36 mmHg.  He remains asymptomatic.  We will continue to monitor for now.  # CAD s/p CABG: Asymptomatic.  Continue aspirin and atorvastatin.  # Carotid stenosis: Mild carotid disease bilaterally. Continue aspirin and atorvastatin.   # Hyperlipidemia: LDL 56 on 09/24/15.  Continue atorvastatin.  Current medicines are reviewed at length with the patient today.  The patient does not have concerns regarding medicines.  The following changes have been made:  no change  Labs/ tests ordered today include:   Orders Placed This Encounter  Procedures  . Basic  metabolic panel  . Magnesium     Disposition:   FU with Glynis Hunsucker C. Oval Linsey, MD, [facility] in 6 months   This note was written with the assistance of speech recognition software.  Please excuse any transcriptional errors.  Signed, Emmett Arntz C. Oval Linsey, MD, [facility]  05/15/2016 5:20 PM    [facility]

## 2016-05-19 DIAGNOSIS — Z96652 Presence of left artificial knee joint: Secondary | ICD-10-CM | POA: Diagnosis not present

## 2016-05-19 DIAGNOSIS — Z471 Aftercare following joint replacement surgery: Secondary | ICD-10-CM | POA: Diagnosis not present

## 2016-05-20 NOTE — Addendum Note (Signed)
Addended by: Alvina Filbert B on: 05/20/2016 02:17 PM   Modules accepted: Orders

## 2016-06-24 DIAGNOSIS — H353132 Nonexudative age-related macular degeneration, bilateral, intermediate dry stage: Secondary | ICD-10-CM | POA: Diagnosis not present

## 2016-06-24 DIAGNOSIS — H5213 Myopia, bilateral: Secondary | ICD-10-CM | POA: Diagnosis not present

## 2016-06-25 DIAGNOSIS — H353131 Nonexudative age-related macular degeneration, bilateral, early dry stage: Secondary | ICD-10-CM | POA: Diagnosis not present

## 2016-06-25 DIAGNOSIS — H35363 Drusen (degenerative) of macula, bilateral: Secondary | ICD-10-CM | POA: Diagnosis not present

## 2016-06-25 DIAGNOSIS — H4720 Unspecified optic atrophy: Secondary | ICD-10-CM | POA: Diagnosis not present

## 2016-06-25 DIAGNOSIS — H35373 Puckering of macula, bilateral: Secondary | ICD-10-CM | POA: Diagnosis not present

## 2016-07-27 DIAGNOSIS — K5900 Constipation, unspecified: Secondary | ICD-10-CM | POA: Diagnosis not present

## 2016-07-27 DIAGNOSIS — M545 Low back pain: Secondary | ICD-10-CM | POA: Diagnosis not present

## 2016-08-06 ENCOUNTER — Ambulatory Visit: Payer: Medicare Other | Admitting: Podiatry

## 2016-08-27 ENCOUNTER — Other Ambulatory Visit: Payer: Self-pay | Admitting: Physician Assistant

## 2016-08-27 DIAGNOSIS — S32010A Wedge compression fracture of first lumbar vertebra, initial encounter for closed fracture: Secondary | ICD-10-CM

## 2016-08-27 DIAGNOSIS — M545 Low back pain: Secondary | ICD-10-CM | POA: Diagnosis not present

## 2016-10-29 ENCOUNTER — Ambulatory Visit (INDEPENDENT_AMBULATORY_CARE_PROVIDER_SITE_OTHER): Payer: Medicare Other | Admitting: Podiatry

## 2016-10-29 ENCOUNTER — Encounter: Payer: Self-pay | Admitting: Podiatry

## 2016-10-29 VITALS — Ht 72.0 in | Wt 218.0 lb

## 2016-10-29 DIAGNOSIS — B351 Tinea unguium: Secondary | ICD-10-CM

## 2016-10-29 DIAGNOSIS — M79676 Pain in unspecified toe(s): Secondary | ICD-10-CM

## 2016-10-29 NOTE — Progress Notes (Signed)
Patient ID: Zachary Ball, male   DOB: 07/22/1925, 83 y.o.   MRN: [id number] Complaint:  Visit Type: Patient returns to my office for continued preventative foot care services. Complaint: Patient states" my nails have grown long and thick and become painful to walk and wear shoe. He presents for preventative foot care services. No changes to ROS.  He says he is scheduled for knee surgery soon..  Podiatric Exam: Vascular: dorsalis pedis and posterior tibial pulses are palpable bilateral. Capillary return is immediate. Temperature gradient is WNL. Skin turgor WNL  Sensorium: Normal Semmes Weinstein monofilament test. Normal tactile sensation bilaterally. Nail Exam: Pt has thick disfigured discolored nails with subungual debris noted bilateral entire nail hallux through fifth toenails Ulcer Exam: There is no evidence of ulcer or pre-ulcerative changes or infection. Orthopedic Exam: Muscle tone and strength are WNL. No limitations in general ROM. No crepitus or effusions noted. Foot type and digits show no abnormalities. Bony prominences are unremarkable. Skin: No Porokeratosis. No infection or ulcers  Diagnosis:  Tinea unguium, Pain in right toe, pain in left toes  Treatment & Plan Procedures and Treatment: Consent by patient was obtained for treatment procedures. The patient understood the discussion of treatment and procedures well. All questions were answered thoroughly reviewed. Debridement of mycotic and hypertrophic toenails, 1 through 5 bilateral and clearing of subungual debris. No ulceration, no infection noted.  Return Visit-Office Procedure: Patient instructed to return to the office for a follow up visit 3 months for continued evaluation and treatment.   Gardiner Barefoot DPM

## 2017-01-29 ENCOUNTER — Encounter: Payer: Self-pay | Admitting: Podiatry

## 2017-01-29 ENCOUNTER — Ambulatory Visit (INDEPENDENT_AMBULATORY_CARE_PROVIDER_SITE_OTHER): Payer: Medicare Other | Admitting: Podiatry

## 2017-01-29 DIAGNOSIS — B351 Tinea unguium: Secondary | ICD-10-CM

## 2017-01-29 DIAGNOSIS — M79676 Pain in unspecified toe(s): Secondary | ICD-10-CM

## 2017-01-29 NOTE — Progress Notes (Signed)
Patient ID: Zachary Ball, male   DOB: 1925-05-02, 83 y.o.   MRN: [id number] Complaint:  Visit Type: Patient returns to my office for continued preventative foot care services. Complaint: Patient states" my nails have grown long and thick and become painful to walk and wear shoe. He presents for preventative foot care services. No changes to ROS.  He says he is scheduled for knee surgery soon..  Podiatric Exam: Vascular: dorsalis pedis and posterior tibial pulses are palpable bilateral. Capillary return is immediate. Temperature gradient is WNL. Skin turgor WNL  Sensorium: Normal Semmes Weinstein monofilament test. Normal tactile sensation bilaterally. Nail Exam: Pt has thick disfigured discolored nails with subungual debris noted bilateral entire nail hallux through fifth toenails Ulcer Exam: There is no evidence of ulcer or pre-ulcerative changes or infection. Orthopedic Exam: Muscle tone and strength are WNL. No limitations in general ROM. No crepitus or effusions noted. Foot type and digits show no abnormalities. Bony prominences are unremarkable. Skin: No Porokeratosis. No infection or ulcers  Diagnosis:  Tinea unguium, Pain in right toe, pain in left toes  Treatment & Plan Procedures and Treatment: Consent by patient was obtained for treatment procedures. The patient understood the discussion of treatment and procedures well. All questions were answered thoroughly reviewed. Debridement of mycotic and hypertrophic toenails, 1 through 5 bilateral and clearing of subungual debris. No ulceration, no infection noted.  Return Visit-Office Procedure: Patient instructed to return to the office for a follow up visit 3 months for continued evaluation and treatment.   Gardiner Barefoot DPM

## 2017-05-06 ENCOUNTER — Encounter: Payer: Self-pay | Admitting: Podiatry

## 2017-05-06 ENCOUNTER — Ambulatory Visit (INDEPENDENT_AMBULATORY_CARE_PROVIDER_SITE_OTHER): Payer: Medicare Other | Admitting: Podiatry

## 2017-05-06 DIAGNOSIS — B351 Tinea unguium: Secondary | ICD-10-CM

## 2017-05-06 DIAGNOSIS — M79676 Pain in unspecified toe(s): Secondary | ICD-10-CM

## 2017-05-06 NOTE — Progress Notes (Signed)
Patient ID: Zachary Ball, male   DOB: May 20, 1925, 83 y.o.   MRN: [id number] Complaint:  Visit Type: Patient returns to my office for continued preventative foot care services. Complaint: Patient states" my nails have grown long and thick and become painful to walk and wear shoe. He presents for preventative foot care services. No changes to ROS.  He says he is scheduled for knee surgery soon..  Podiatric Exam: Vascular: dorsalis pedis and posterior tibial pulses are palpable bilateral. Capillary return is immediate. Temperature gradient is WNL. Skin turgor WNL  Sensorium: Normal Semmes Weinstein monofilament test. Normal tactile sensation bilaterally. Nail Exam: Pt has thick disfigured discolored nails with subungual debris noted bilateral entire nail hallux through fifth toenails Ulcer Exam: There is no evidence of ulcer or pre-ulcerative changes or infection. Orthopedic Exam: Muscle tone and strength are WNL. No limitations in general ROM. No crepitus or effusions noted. Foot type and digits show no abnormalities. Bony prominences are unremarkable. Skin: No Porokeratosis. No infection or ulcers  Diagnosis:  Tinea unguium, Pain in right toe, pain in left toes  Treatment & Plan Procedures and Treatment: Consent by patient was obtained for treatment procedures. The patient understood the discussion of treatment and procedures well. All questions were answered thoroughly reviewed. Debridement of mycotic and hypertrophic toenails, 1 through 5 bilateral and clearing of subungual debris. No ulceration, no infection noted. Self avulsed fifth toenail right foot healing. Return Visit-Office Procedure: Patient instructed to return to the office for a follow up visit 3 months for continued evaluation and treatment.   Gardiner Barefoot DPM

## 2017-06-25 DIAGNOSIS — Z961 Presence of intraocular lens: Secondary | ICD-10-CM | POA: Diagnosis not present

## 2017-06-25 DIAGNOSIS — H353133 Nonexudative age-related macular degeneration, bilateral, advanced atrophic without subfoveal involvement: Secondary | ICD-10-CM | POA: Diagnosis not present

## 2017-07-01 DIAGNOSIS — H35373 Puckering of macula, bilateral: Secondary | ICD-10-CM | POA: Diagnosis not present

## 2017-07-01 DIAGNOSIS — H43811 Vitreous degeneration, right eye: Secondary | ICD-10-CM | POA: Diagnosis not present

## 2017-07-01 DIAGNOSIS — H35352 Cystoid macular degeneration, left eye: Secondary | ICD-10-CM | POA: Diagnosis not present

## 2017-07-01 DIAGNOSIS — H353131 Nonexudative age-related macular degeneration, bilateral, early dry stage: Secondary | ICD-10-CM | POA: Diagnosis not present

## 2017-07-01 DIAGNOSIS — H353221 Exudative age-related macular degeneration, left eye, with active choroidal neovascularization: Secondary | ICD-10-CM | POA: Diagnosis not present

## 2017-08-05 ENCOUNTER — Ambulatory Visit (INDEPENDENT_AMBULATORY_CARE_PROVIDER_SITE_OTHER): Payer: Medicare Other | Admitting: Podiatry

## 2017-08-05 ENCOUNTER — Encounter: Payer: Self-pay | Admitting: Podiatry

## 2017-08-05 DIAGNOSIS — B351 Tinea unguium: Secondary | ICD-10-CM | POA: Diagnosis not present

## 2017-08-05 DIAGNOSIS — M79676 Pain in unspecified toe(s): Secondary | ICD-10-CM

## 2017-08-05 NOTE — Progress Notes (Signed)
Patient ID: Zachary Ball, male   DOB: 1925-08-19, 83 y.o.   MRN: [id number] Complaint:  Visit Type: Patient returns to my office for continued preventative foot care services. Complaint: Patient states" my nails have grown long and thick and become painful to walk and wear shoe. He presents for preventative foot care services.   Podiatric Exam: Vascular: dorsalis pedis and posterior tibial pulses are palpable bilateral. Capillary return is immediate. Temperature gradient is WNL. Skin turgor WNL  Sensorium: Normal Semmes Weinstein monofilament test. Normal tactile sensation bilaterally. Nail Exam: Pt has thick disfigured discolored nails with subungual debris noted bilateral entire nail hallux through fifth toenails Ulcer Exam: There is no evidence of ulcer or pre-ulcerative changes or infection. Orthopedic Exam: Muscle tone and strength are WNL. No limitations in general ROM. No crepitus or effusions noted. Foot type and digits show no abnormalities. Bony prominences are unremarkable. Skin: No Porokeratosis. No infection or ulcers  Diagnosis:  Tinea unguium, Pain in right toe, pain in left toes  Treatment & Plan Procedures and Treatment: Consent by patient was obtained for treatment procedures. The patient understood the discussion of treatment and procedures well. All questions were answered thoroughly reviewed. Debridement of mycotic and hypertrophic toenails, 1 through 5 bilateral and clearing of subungual debris. No ulceration, no infection noted. ABN signed for 2018. Return Visit-Office Procedure: Patient instructed to return to the office for a follow up visit 3 months for continued evaluation and treatment.   Gardiner Barefoot DPM

## 2017-08-06 DIAGNOSIS — H35363 Drusen (degenerative) of macula, bilateral: Secondary | ICD-10-CM | POA: Diagnosis not present

## 2017-08-06 DIAGNOSIS — H353131 Nonexudative age-related macular degeneration, bilateral, early dry stage: Secondary | ICD-10-CM | POA: Diagnosis not present

## 2017-08-06 DIAGNOSIS — H35352 Cystoid macular degeneration, left eye: Secondary | ICD-10-CM | POA: Diagnosis not present

## 2017-08-06 DIAGNOSIS — H353221 Exudative age-related macular degeneration, left eye, with active choroidal neovascularization: Secondary | ICD-10-CM | POA: Diagnosis not present

## 2017-08-08 DIAGNOSIS — L820 Inflamed seborrheic keratosis: Secondary | ICD-10-CM | POA: Diagnosis not present

## 2017-09-17 DIAGNOSIS — H43811 Vitreous degeneration, right eye: Secondary | ICD-10-CM | POA: Diagnosis not present

## 2017-09-17 DIAGNOSIS — H35352 Cystoid macular degeneration, left eye: Secondary | ICD-10-CM | POA: Diagnosis not present

## 2017-09-17 DIAGNOSIS — H43812 Vitreous degeneration, left eye: Secondary | ICD-10-CM | POA: Diagnosis not present

## 2017-09-17 DIAGNOSIS — H35372 Puckering of macula, left eye: Secondary | ICD-10-CM | POA: Diagnosis not present

## 2017-09-17 DIAGNOSIS — H353131 Nonexudative age-related macular degeneration, bilateral, early dry stage: Secondary | ICD-10-CM | POA: Diagnosis not present

## 2017-09-17 DIAGNOSIS — H353221 Exudative age-related macular degeneration, left eye, with active choroidal neovascularization: Secondary | ICD-10-CM | POA: Diagnosis not present

## 2017-11-04 ENCOUNTER — Ambulatory Visit (INDEPENDENT_AMBULATORY_CARE_PROVIDER_SITE_OTHER): Payer: Medicare Other | Admitting: Podiatry

## 2017-11-04 ENCOUNTER — Encounter: Payer: Self-pay | Admitting: Podiatry

## 2017-11-04 DIAGNOSIS — M79676 Pain in unspecified toe(s): Secondary | ICD-10-CM

## 2017-11-04 DIAGNOSIS — B351 Tinea unguium: Secondary | ICD-10-CM

## 2017-11-04 NOTE — Progress Notes (Signed)
Patient ID: Zachary Ball, male   DOB: 05/31/1925, 84 y.o.   MRN: [id number] Complaint:  Visit Type: Patient returns to my office for continued preventative foot care services. Complaint: Patient states" my nails have grown long and thick and become painful to walk and wear shoe. He presents for preventative foot care services.   Podiatric Exam: Vascular: dorsalis pedis and posterior tibial pulses are palpable bilateral. Capillary return is immediate. Temperature gradient is WNL. Skin turgor WNL  Sensorium: Normal Semmes Weinstein monofilament test. Normal tactile sensation bilaterally. Nail Exam: Pt has thick disfigured discolored nails with subungual debris noted bilateral entire nail hallux through fifth toenails Ulcer Exam: There is no evidence of ulcer or pre-ulcerative changes or infection. Orthopedic Exam: Muscle tone and strength are WNL. No limitations in general ROM. No crepitus or effusions noted. Foot type and digits show no abnormalities. Bony prominences are unremarkable. Skin: No Porokeratosis. No infection or ulcers  Diagnosis:  Tinea unguium, Pain in right toe, pain in left toes  Treatment & Plan Procedures and Treatment: Consent by patient was obtained for treatment procedures. The patient understood the discussion of treatment and procedures well. All questions were answered thoroughly reviewed. Debridement of mycotic and hypertrophic toenails, 1 through 5 bilateral and clearing of subungual debris. No ulceration, no infection noted. ABN signed for 2019. Return Visit-Office Procedure: Patient instructed to return to the office for a follow up visit 3 months for continued evaluation and treatment.   Gardiner Barefoot DPM

## 2017-11-05 DIAGNOSIS — H353221 Exudative age-related macular degeneration, left eye, with active choroidal neovascularization: Secondary | ICD-10-CM | POA: Diagnosis not present

## 2017-11-05 DIAGNOSIS — H353131 Nonexudative age-related macular degeneration, bilateral, early dry stage: Secondary | ICD-10-CM | POA: Diagnosis not present

## 2017-11-05 DIAGNOSIS — H35363 Drusen (degenerative) of macula, bilateral: Secondary | ICD-10-CM | POA: Diagnosis not present

## 2017-11-05 DIAGNOSIS — H35352 Cystoid macular degeneration, left eye: Secondary | ICD-10-CM | POA: Diagnosis not present

## 2017-12-31 DIAGNOSIS — H353221 Exudative age-related macular degeneration, left eye, with active choroidal neovascularization: Secondary | ICD-10-CM | POA: Diagnosis not present

## 2017-12-31 DIAGNOSIS — H35352 Cystoid macular degeneration, left eye: Secondary | ICD-10-CM | POA: Diagnosis not present

## 2017-12-31 DIAGNOSIS — H43812 Vitreous degeneration, left eye: Secondary | ICD-10-CM | POA: Diagnosis not present

## 2018-01-01 DIAGNOSIS — H04122 Dry eye syndrome of left lacrimal gland: Secondary | ICD-10-CM | POA: Diagnosis not present

## 2018-02-03 ENCOUNTER — Ambulatory Visit (INDEPENDENT_AMBULATORY_CARE_PROVIDER_SITE_OTHER): Payer: Medicare Other | Admitting: Podiatry

## 2018-02-03 ENCOUNTER — Encounter: Payer: Self-pay | Admitting: Podiatry

## 2018-02-03 DIAGNOSIS — M79676 Pain in unspecified toe(s): Secondary | ICD-10-CM

## 2018-02-03 DIAGNOSIS — B351 Tinea unguium: Secondary | ICD-10-CM

## 2018-02-03 NOTE — Progress Notes (Signed)
Patient ID: Zachary Ball, male   DOB: 07/17/1925, 84 y.o.   MRN: [id number] Complaint:  Visit Type: Patient returns to my office for continued preventative foot care services. Complaint: Patient states" my nails have grown long and thick and become painful to walk and wear shoe. He presents for preventative foot care services.   Podiatric Exam: Vascular: dorsalis pedis and posterior tibial pulses are palpable bilateral. Capillary return is immediate. Temperature gradient is WNL. Skin turgor WNL  Sensorium: Normal Semmes Weinstein monofilament test. Normal tactile sensation bilaterally. Nail Exam: Pt has thick disfigured discolored nails with subungual debris noted bilateral entire nail hallux through fifth toenails Ulcer Exam: There is no evidence of ulcer or pre-ulcerative changes or infection. Orthopedic Exam: Muscle tone and strength are WNL. No limitations in general ROM. No crepitus or effusions noted. Foot type and digits show no abnormalities. Bony prominences are unremarkable. Skin: No Porokeratosis. No infection or ulcers  Diagnosis:  Tinea unguium, Pain in right toe, pain in left toes  Treatment & Plan Procedures and Treatment: Consent by patient was obtained for treatment procedures. The patient understood the discussion of treatment and procedures well. All questions were answered thoroughly reviewed. Debridement of mycotic and hypertrophic toenails, 1 through 5 bilateral and clearing of subungual debris. No ulceration, no infection noted. ABN signed for 2019. Return Visit-Office Procedure: Patient instructed to return to the office for a follow up visit 3 months for continued evaluation and treatment.   Gurnoor Ursua DPM 

## 2018-03-11 DIAGNOSIS — H353131 Nonexudative age-related macular degeneration, bilateral, early dry stage: Secondary | ICD-10-CM | POA: Diagnosis not present

## 2018-03-11 DIAGNOSIS — H35373 Puckering of macula, bilateral: Secondary | ICD-10-CM | POA: Diagnosis not present

## 2018-03-11 DIAGNOSIS — H353221 Exudative age-related macular degeneration, left eye, with active choroidal neovascularization: Secondary | ICD-10-CM | POA: Diagnosis not present

## 2018-03-11 DIAGNOSIS — H35352 Cystoid macular degeneration, left eye: Secondary | ICD-10-CM | POA: Diagnosis not present

## 2018-03-11 DIAGNOSIS — H4720 Unspecified optic atrophy: Secondary | ICD-10-CM | POA: Diagnosis not present

## 2018-05-05 ENCOUNTER — Encounter: Payer: Self-pay | Admitting: Podiatry

## 2018-05-05 ENCOUNTER — Ambulatory Visit (INDEPENDENT_AMBULATORY_CARE_PROVIDER_SITE_OTHER): Payer: Medicare Other | Admitting: Podiatry

## 2018-05-05 DIAGNOSIS — M79676 Pain in unspecified toe(s): Secondary | ICD-10-CM | POA: Diagnosis not present

## 2018-05-05 DIAGNOSIS — H0100A Unspecified blepharitis right eye, upper and lower eyelids: Secondary | ICD-10-CM | POA: Diagnosis not present

## 2018-05-05 DIAGNOSIS — H16121 Filamentary keratitis, right eye: Secondary | ICD-10-CM | POA: Diagnosis not present

## 2018-05-05 DIAGNOSIS — B351 Tinea unguium: Secondary | ICD-10-CM | POA: Diagnosis not present

## 2018-05-05 DIAGNOSIS — H5711 Ocular pain, right eye: Secondary | ICD-10-CM | POA: Diagnosis not present

## 2018-05-05 DIAGNOSIS — H04121 Dry eye syndrome of right lacrimal gland: Secondary | ICD-10-CM | POA: Diagnosis not present

## 2018-05-05 NOTE — Progress Notes (Signed)
Patient ID: Zachary Ball, male   DOB: [birth year], 84 y.o.   MRN: [id number] Complaint:  Visit Type: Patient returns to my office for continued preventative foot care services. Complaint: Patient states" my nails have grown long and thick and become painful to walk and wear shoe. He presents for preventative foot care services.   Podiatric Exam: Vascular: dorsalis pedis and posterior tibial pulses are palpable bilateral. Capillary return is immediate. Temperature gradient is WNL. Skin turgor WNL  Sensorium: Normal Semmes Weinstein monofilament test. Normal tactile sensation bilaterally. Nail Exam: Pt has thick disfigured discolored nails with subungual debris noted bilateral entire nail hallux through fifth toenails Ulcer Exam: There is no evidence of ulcer or pre-ulcerative changes or infection. Orthopedic Exam: Muscle tone and strength are WNL. No limitations in general ROM. No crepitus or effusions noted. Foot type and digits show no abnormalities. Bony prominences are unremarkable. Skin: No Porokeratosis. No infection or ulcers  Diagnosis:  Tinea unguium, Pain in right toe, pain in left toes  Treatment & Plan Procedures and Treatment: Consent by patient was obtained for treatment procedures. The patient understood the discussion of treatment and procedures well. All questions were answered thoroughly reviewed. Debridement of mycotic and hypertrophic toenails, 1 through 5 bilateral and clearing of subungual debris. No ulceration, no infection noted. ABN signed for 2019. Return Visit-Office Procedure: Patient instructed to return to the office for a follow up visit 3 months for continued evaluation and treatment.   Gardiner Barefoot DPM

## 2018-06-10 DIAGNOSIS — H353221 Exudative age-related macular degeneration, left eye, with active choroidal neovascularization: Secondary | ICD-10-CM | POA: Diagnosis not present

## 2018-06-10 DIAGNOSIS — H35372 Puckering of macula, left eye: Secondary | ICD-10-CM | POA: Diagnosis not present

## 2018-06-10 DIAGNOSIS — H43812 Vitreous degeneration, left eye: Secondary | ICD-10-CM | POA: Diagnosis not present

## 2018-06-10 DIAGNOSIS — H35352 Cystoid macular degeneration, left eye: Secondary | ICD-10-CM | POA: Diagnosis not present

## 2018-06-30 DIAGNOSIS — H353133 Nonexudative age-related macular degeneration, bilateral, advanced atrophic without subfoveal involvement: Secondary | ICD-10-CM | POA: Diagnosis not present

## 2018-06-30 DIAGNOSIS — Z961 Presence of intraocular lens: Secondary | ICD-10-CM | POA: Diagnosis not present

## 2018-08-04 ENCOUNTER — Encounter: Payer: Self-pay | Admitting: Podiatry

## 2018-08-04 ENCOUNTER — Ambulatory Visit (INDEPENDENT_AMBULATORY_CARE_PROVIDER_SITE_OTHER): Payer: Medicare Other | Admitting: Podiatry

## 2018-08-04 DIAGNOSIS — B351 Tinea unguium: Secondary | ICD-10-CM | POA: Diagnosis not present

## 2018-08-04 DIAGNOSIS — M79676 Pain in unspecified toe(s): Secondary | ICD-10-CM

## 2018-08-04 NOTE — Progress Notes (Addendum)
Patient ID: Zachary Ball, male   DOB: 1925-05-23, 84 y.o.   MRN: [id number] Complaint:  Visit Type: Patient returns to my office for continued preventative foot care services. Complaint: Patient states" my nails have grown long and thick and become painful to walk and wear shoe. He presents for preventative foot care services.   Podiatric Exam: Vascular: dorsalis pedis and posterior tibial pulses are palpable bilateral. Capillary return is immediate. Temperature gradient is WNL. Skin turgor WNL  Sensorium: Normal Semmes Weinstein monofilament test. Normal tactile sensation bilaterally. Nail Exam: Pt has thick disfigured discolored nails with subungual debris noted bilateral entire nail hallux through fifth toenails Ulcer Exam: There is no evidence of ulcer or pre-ulcerative changes or infection. Orthopedic Exam: Muscle tone and strength are WNL. No limitations in general ROM. No crepitus or effusions noted. Foot type and digits show no abnormalities. Bony prominences are unremarkable. Skin: No Porokeratosis. No infection or ulcers  Diagnosis:  Tinea unguium, Pain in right toe, pain in left toes  Treatment & Plan Procedures and Treatment: Consent by patient was obtained for treatment procedures. The patient understood the discussion of treatment and procedures well. All questions were answered thoroughly reviewed. Debridement of mycotic and hypertrophic toenails, 1 through 5 bilateral and clearing of subungual debris. No ulceration, no infection noted. ABN signed for 2019.  Prescribe fungus medicine. Return Visit-Office Procedure: Patient instructed to return to the office for a follow up visit 3 months for continued evaluation and treatment.   Gardiner Barefoot DPM

## 2018-10-07 ENCOUNTER — Encounter: Payer: Self-pay | Admitting: Podiatry

## 2018-11-03 ENCOUNTER — Ambulatory Visit: Payer: Medicare Other | Admitting: Podiatry

## 2018-11-10 ENCOUNTER — Ambulatory Visit (INDEPENDENT_AMBULATORY_CARE_PROVIDER_SITE_OTHER): Payer: Medicare Other | Admitting: Podiatry

## 2018-11-10 ENCOUNTER — Other Ambulatory Visit: Payer: Self-pay

## 2018-11-10 ENCOUNTER — Encounter: Payer: Self-pay | Admitting: Podiatry

## 2018-11-10 DIAGNOSIS — M79676 Pain in unspecified toe(s): Secondary | ICD-10-CM

## 2018-11-10 DIAGNOSIS — B351 Tinea unguium: Secondary | ICD-10-CM | POA: Diagnosis not present

## 2018-11-10 NOTE — Progress Notes (Signed)
Patient ID: Zachary Ball, male   DOB: Jan 02, 1925, 85 y.o.   MRN: [id number] Complaint:  Visit Type: Patient returns to my office for continued preventative foot care services. Complaint: Patient states" my nails have grown long and thick and become painful to walk and wear shoe. He presents for preventative foot care services.   Podiatric Exam: Vascular: dorsalis pedis and posterior tibial pulses are palpable bilateral. Capillary return is immediate. Temperature gradient is WNL. Skin turgor WNL  Sensorium: Normal Semmes Weinstein monofilament test. Normal tactile sensation bilaterally. Nail Exam: Pt has thick disfigured discolored nails with subungual debris noted bilateral entire nail hallux through fifth toenails Ulcer Exam: There is no evidence of ulcer or pre-ulcerative changes or infection. Orthopedic Exam: Muscle tone and strength are WNL. No limitations in general ROM. No crepitus or effusions noted. Foot type and digits show no abnormalities. Bony prominences are unremarkable. Skin: No Porokeratosis. No infection or ulcers  Diagnosis:  Tinea unguium, Pain in right toe, pain in left toes  Treatment & Plan Procedures and Treatment: Consent by patient was obtained for treatment procedures. The patient understood the discussion of treatment and procedures well. All questions were answered thoroughly reviewed. Debridement of mycotic and hypertrophic toenails, 1 through 5 bilateral and clearing of subungual debris. No ulceration, no infection noted. Self avulsed hallux toenail right foot. Return Visit-Office Procedure: Patient instructed to return to the office for a follow up visit 3 months for continued evaluation and treatment.   Gardiner Barefoot DPM

## 2019-02-09 ENCOUNTER — Other Ambulatory Visit: Payer: Self-pay

## 2019-02-09 ENCOUNTER — Encounter: Payer: Self-pay | Admitting: Podiatry

## 2019-02-09 ENCOUNTER — Ambulatory Visit (INDEPENDENT_AMBULATORY_CARE_PROVIDER_SITE_OTHER): Payer: Medicare Other | Admitting: Podiatry

## 2019-02-09 DIAGNOSIS — B351 Tinea unguium: Secondary | ICD-10-CM | POA: Diagnosis not present

## 2019-02-09 DIAGNOSIS — M79675 Pain in left toe(s): Secondary | ICD-10-CM | POA: Diagnosis not present

## 2019-02-09 DIAGNOSIS — M79674 Pain in right toe(s): Secondary | ICD-10-CM

## 2019-02-09 NOTE — Progress Notes (Signed)
Patient ID: Zachary Ball, male   DOB: 08-21-1925, 85 y.o.   MRN: [id number] Complaint:  Visit Type: Patient returns to my office for continued preventative foot care services. Complaint: Patient states" my nails have grown long and thick and become painful to walk and wear shoe. He presents for preventative foot care services.   Podiatric Exam: Vascular: dorsalis pedis and posterior tibial pulses are palpable bilateral. Capillary return is immediate. Temperature gradient is WNL. Skin turgor WNL  Sensorium: Normal Semmes Weinstein monofilament test. Normal tactile sensation bilaterally. Nail Exam: Pt has thick disfigured discolored nails with subungual debris noted bilateral entire nail hallux through fifth toenails Ulcer Exam: There is no evidence of ulcer or pre-ulcerative changes or infection. Orthopedic Exam: Muscle tone and strength are WNL. No limitations in general ROM. No crepitus or effusions noted. Foot type and digits show no abnormalities. Bony prominences are unremarkable. Skin: No Porokeratosis. No infection or ulcers  Diagnosis:  Tinea unguium, Pain in right toe, pain in left toes  Treatment & Plan Procedures and Treatment: Consent by patient was obtained for treatment procedures. The patient understood the discussion of treatment and procedures well. All questions were answered thoroughly reviewed. Debridement of mycotic and hypertrophic toenails, 1 through 5 bilateral and clearing of subungual debris. No ulceration, no infection noted.  Return Visit-Office Procedure: Patient instructed to return to the office for a follow up visit 3 months for continued evaluation and treatment.   Gardiner Barefoot DPM

## 2019-05-18 ENCOUNTER — Ambulatory Visit (INDEPENDENT_AMBULATORY_CARE_PROVIDER_SITE_OTHER): Payer: Medicare Other | Admitting: Podiatry

## 2019-05-18 ENCOUNTER — Encounter: Payer: Self-pay | Admitting: Podiatry

## 2019-05-18 ENCOUNTER — Other Ambulatory Visit: Payer: Self-pay

## 2019-05-18 DIAGNOSIS — M79674 Pain in right toe(s): Secondary | ICD-10-CM | POA: Diagnosis not present

## 2019-05-18 DIAGNOSIS — B351 Tinea unguium: Secondary | ICD-10-CM

## 2019-05-18 DIAGNOSIS — M79675 Pain in left toe(s): Secondary | ICD-10-CM

## 2019-05-18 NOTE — Progress Notes (Signed)
Patient ID: RAIYAN DORRY, male   DOB: May 09, 1925, 85 y.o.   MRN: [id number] Complaint:  Visit Type: Patient returns to my office for continued preventative foot care services. Complaint: Patient states" my nails have grown long and thick and become painful to walk and wear shoe. He presents for preventative foot care services.   Podiatric Exam: Vascular: dorsalis pedis and posterior tibial pulses are palpable bilateral. Capillary return is immediate. Temperature gradient is WNL. Skin turgor WNL  Sensorium: Normal Semmes Weinstein monofilament test. Normal tactile sensation bilaterally. Nail Exam: Pt has thick disfigured discolored nails with subungual debris noted bilateral entire nail hallux through fifth toenails Ulcer Exam: There is no evidence of ulcer or pre-ulcerative changes or infection. Orthopedic Exam: Muscle tone and strength are WNL. No limitations in general ROM. No crepitus or effusions noted. Foot type and digits show no abnormalities. Bony prominences are unremarkable. Skin: No Porokeratosis. No infection or ulcers  Diagnosis:  Tinea unguium, Pain in right toe, pain in left toes  Treatment & Plan Procedures and Treatment: Consent by patient was obtained for treatment procedures. The patient understood the discussion of treatment and procedures well. All questions were answered thoroughly reviewed. Debridement of mycotic and hypertrophic toenails, 1 through 5 bilateral and clearing of subungual debris. No ulceration, no infection noted. Patient is moving to [place]. Return Visit-Office Procedure: Patient instructed to return to the office for a follow up visit prn for continued evaluation and treatment.   Gardiner Barefoot DPM

## 2019-08-17 ENCOUNTER — Ambulatory Visit: Payer: Medicare Other | Admitting: Podiatry

## 2022-06-25 DEATH — deceased
